# Patient Record
Sex: Male | Born: 1965 | Race: White | Hispanic: No | Marital: Married | Smoking: Never smoker
Health system: Southern US, Community
[De-identification: ages and names within clinical notes are randomized; demographics above are authoritative.]

## PROBLEM LIST (undated history)

## (undated) DIAGNOSIS — Z460 Encounter for fitting and adjustment of spectacles and contact lenses: Secondary | ICD-10-CM

## (undated) DIAGNOSIS — K219 Gastro-esophageal reflux disease without esophagitis: Secondary | ICD-10-CM

## (undated) DIAGNOSIS — L570 Actinic keratosis: Secondary | ICD-10-CM

## (undated) DIAGNOSIS — I10 Essential (primary) hypertension: Secondary | ICD-10-CM

## (undated) DIAGNOSIS — Q2381 Bicuspid aortic valve: Secondary | ICD-10-CM

## (undated) DIAGNOSIS — Z8669 Personal history of other diseases of the nervous system and sense organs: Secondary | ICD-10-CM

## (undated) DIAGNOSIS — M7541 Impingement syndrome of right shoulder: Secondary | ICD-10-CM

## (undated) DIAGNOSIS — E785 Hyperlipidemia, unspecified: Secondary | ICD-10-CM

## (undated) DIAGNOSIS — S22089A Unspecified fracture of T11-T12 vertebra, initial encounter for closed fracture: Secondary | ICD-10-CM

## (undated) DIAGNOSIS — I712 Thoracic aortic aneurysm, without rupture, unspecified: Secondary | ICD-10-CM

## (undated) DIAGNOSIS — Q231 Congenital insufficiency of aortic valve: Secondary | ICD-10-CM

## (undated) DIAGNOSIS — M75101 Unspecified rotator cuff tear or rupture of right shoulder, not specified as traumatic: Secondary | ICD-10-CM

## (undated) HISTORY — PX: COLONOSCOPY: SHX174

## (undated) HISTORY — PX: UPPER GI ENDOSCOPY: SHX6162

## (undated) HISTORY — DX: Actinic keratosis: L57.0

## (undated) HISTORY — DX: Unspecified fracture of t11-T12 vertebra, initial encounter for closed fracture: S22.089A

## (undated) HISTORY — DX: Personal history of other diseases of the nervous system and sense organs: Z86.69

---

## 2001-05-08 ENCOUNTER — Ambulatory Visit (HOSPITAL_COMMUNITY): Admission: RE | Admit: 2001-05-08 | Discharge: 2001-05-08 | Payer: Self-pay | Admitting: Cardiovascular Disease

## 2003-09-10 DIAGNOSIS — S22089A Unspecified fracture of T11-T12 vertebra, initial encounter for closed fracture: Secondary | ICD-10-CM

## 2003-09-10 HISTORY — DX: Unspecified fracture of t11-T12 vertebra, initial encounter for closed fracture: S22.089A

## 2006-02-07 ENCOUNTER — Ambulatory Visit: Payer: Self-pay

## 2006-03-01 ENCOUNTER — Ambulatory Visit: Payer: Self-pay | Admitting: Unknown Physician Specialty

## 2006-09-05 ENCOUNTER — Ambulatory Visit: Payer: Self-pay | Admitting: Gastroenterology

## 2008-08-16 ENCOUNTER — Ambulatory Visit: Payer: Self-pay

## 2008-09-08 ENCOUNTER — Ambulatory Visit: Payer: Self-pay | Admitting: Unknown Physician Specialty

## 2009-04-01 ENCOUNTER — Ambulatory Visit: Payer: Self-pay | Admitting: Unknown Physician Specialty

## 2010-04-19 ENCOUNTER — Ambulatory Visit: Payer: Self-pay | Admitting: Otolaryngology

## 2011-11-23 ENCOUNTER — Observation Stay: Payer: Self-pay | Admitting: Surgery

## 2011-11-23 LAB — COMPREHENSIVE METABOLIC PANEL
Albumin: 4.5 g/dL (ref 3.4–5.0)
Alkaline Phosphatase: 43 U/L — ABNORMAL LOW (ref 50–136)
BUN: 17 mg/dL (ref 7–18)
Chloride: 101 mmol/L (ref 98–107)
Creatinine: 1.05 mg/dL (ref 0.60–1.30)
Osmolality: 282 (ref 275–301)
Potassium: 3.8 mmol/L (ref 3.5–5.1)

## 2011-11-23 LAB — CBC
HGB: 15.4 g/dL (ref 13.0–18.0)
MCHC: 33.6 g/dL (ref 32.0–36.0)
RBC: 4.67 10*6/uL (ref 4.40–5.90)
WBC: 11.2 10*3/uL — ABNORMAL HIGH (ref 3.8–10.6)

## 2011-11-23 LAB — URINALYSIS, COMPLETE
Blood: NEGATIVE
Glucose,UR: NEGATIVE mg/dL (ref 0–75)
Nitrite: NEGATIVE
Ph: 7 (ref 4.5–8.0)
Protein: NEGATIVE
Specific Gravity: 1.02 (ref 1.003–1.030)

## 2011-11-23 LAB — LIPASE, BLOOD: Lipase: 99 U/L (ref 73–393)

## 2011-12-09 ENCOUNTER — Ambulatory Visit: Payer: Self-pay | Admitting: Surgery

## 2011-12-09 HISTORY — PX: APPENDECTOMY: SHX54

## 2012-08-15 ENCOUNTER — Ambulatory Visit: Payer: Self-pay | Admitting: Unknown Physician Specialty

## 2012-08-26 ENCOUNTER — Encounter (HOSPITAL_BASED_OUTPATIENT_CLINIC_OR_DEPARTMENT_OTHER): Payer: Self-pay | Admitting: *Deleted

## 2012-08-26 NOTE — Progress Notes (Signed)
Pt ENT in [place]-wife pediatrician,brother cardiology [place] Had append 4/13 [facility]-no problems About 06 had cardiac cath at [facility]-normal-no problems since-no records available

## 2012-08-28 ENCOUNTER — Ambulatory Visit (HOSPITAL_BASED_OUTPATIENT_CLINIC_OR_DEPARTMENT_OTHER): Payer: BC Managed Care – PPO | Admitting: Anesthesiology

## 2012-08-28 ENCOUNTER — Encounter (HOSPITAL_BASED_OUTPATIENT_CLINIC_OR_DEPARTMENT_OTHER): Payer: Self-pay | Admitting: Anesthesiology

## 2012-08-28 ENCOUNTER — Encounter (HOSPITAL_BASED_OUTPATIENT_CLINIC_OR_DEPARTMENT_OTHER): Payer: Self-pay | Admitting: *Deleted

## 2012-08-28 ENCOUNTER — Encounter (HOSPITAL_BASED_OUTPATIENT_CLINIC_OR_DEPARTMENT_OTHER): Payer: Self-pay | Admitting: Orthopedic Surgery

## 2012-08-28 ENCOUNTER — Encounter (HOSPITAL_BASED_OUTPATIENT_CLINIC_OR_DEPARTMENT_OTHER)
Admission: RE | Disposition: A | Discharge status: Home / Self Care | Payer: Self-pay | Source: Ambulatory Visit | Attending: Orthopedic Surgery

## 2012-08-28 ENCOUNTER — Ambulatory Visit (HOSPITAL_BASED_OUTPATIENT_CLINIC_OR_DEPARTMENT_OTHER)
Admission: RE | Admit: 2012-08-28 | Discharge: 2012-08-28 | Disposition: A | Discharge status: Home / Self Care | Payer: BC Managed Care – PPO | Source: Ambulatory Visit | Attending: Orthopedic Surgery | Admitting: Orthopedic Surgery

## 2012-08-28 DIAGNOSIS — Z9089 Acquired absence of other organs: Secondary | ICD-10-CM | POA: Insufficient documentation

## 2012-08-28 DIAGNOSIS — M25819 Other specified joint disorders, unspecified shoulder: Secondary | ICD-10-CM | POA: Insufficient documentation

## 2012-08-28 DIAGNOSIS — M67919 Unspecified disorder of synovium and tendon, unspecified shoulder: Secondary | ICD-10-CM | POA: Insufficient documentation

## 2012-08-28 DIAGNOSIS — M24819 Other specific joint derangements of unspecified shoulder, not elsewhere classified: Secondary | ICD-10-CM | POA: Insufficient documentation

## 2012-08-28 DIAGNOSIS — K219 Gastro-esophageal reflux disease without esophagitis: Secondary | ICD-10-CM | POA: Insufficient documentation

## 2012-08-28 DIAGNOSIS — M7541 Impingement syndrome of right shoulder: Secondary | ICD-10-CM

## 2012-08-28 DIAGNOSIS — M719 Bursopathy, unspecified: Secondary | ICD-10-CM | POA: Insufficient documentation

## 2012-08-28 DIAGNOSIS — E785 Hyperlipidemia, unspecified: Secondary | ICD-10-CM | POA: Insufficient documentation

## 2012-08-28 DIAGNOSIS — M75101 Unspecified rotator cuff tear or rupture of right shoulder, not specified as traumatic: Secondary | ICD-10-CM | POA: Diagnosis present

## 2012-08-28 HISTORY — DX: Gastro-esophageal reflux disease without esophagitis: K21.9

## 2012-08-28 HISTORY — DX: Impingement syndrome of right shoulder: M75.41

## 2012-08-28 HISTORY — DX: Unspecified rotator cuff tear or rupture of right shoulder, not specified as traumatic: M75.101

## 2012-08-28 HISTORY — DX: Hyperlipidemia, unspecified: E78.5

## 2012-08-28 HISTORY — DX: Encounter for fitting and adjustment of spectacles and contact lenses: Z46.0

## 2012-08-28 HISTORY — PX: SHOULDER ARTHROSCOPY WITH ROTATOR CUFF REPAIR AND SUBACROMIAL DECOMPRESSION: SHX5686

## 2012-08-28 SURGERY — SHOULDER ARTHROSCOPY WITH ROTATOR CUFF REPAIR AND SUBACROMIAL DECOMPRESSION
Anesthesia: Regional | Site: Shoulder | Laterality: Right | Wound class: Clean

## 2012-08-28 MED ORDER — BUPIVACAINE-EPINEPHRINE PF 0.5-1:200000 % IJ SOLN
INTRAMUSCULAR | Status: DC | PRN
  Administered 2012-08-28: 25 mL

## 2012-08-28 MED ORDER — PROPOFOL 10 MG/ML IV BOLUS
INTRAVENOUS | Status: DC | PRN
  Administered 2012-08-28: 180 mg via INTRAVENOUS

## 2012-08-28 MED ORDER — PROMETHAZINE HCL 25 MG PO TABS: 25.0000 mg | ORAL_TABLET | Freq: Four times a day (QID) | ORAL | Status: DC | PRN

## 2012-08-28 MED ORDER — DEXAMETHASONE SODIUM PHOSPHATE 4 MG/ML IJ SOLN
INTRAMUSCULAR | Status: DC | PRN
  Administered 2012-08-28: 10 mg via INTRAVENOUS

## 2012-08-28 MED ORDER — HYDROMORPHONE HCL PF 1 MG/ML IJ SOLN
0.2500 mg | INTRAMUSCULAR | Status: DC | PRN
Start: 2012-08-28 — End: 2012-08-28

## 2012-08-28 MED ORDER — METHOCARBAMOL 500 MG PO TABS: 500.0000 mg | ORAL_TABLET | Freq: Four times a day (QID) | ORAL | Status: DC

## 2012-08-28 MED ORDER — LIDOCAINE HCL (CARDIAC) 20 MG/ML IV SOLN
INTRAVENOUS | Status: DC | PRN
  Administered 2012-08-28: 80 mg via INTRAVENOUS

## 2012-08-28 MED ORDER — FENTANYL CITRATE 0.05 MG/ML IJ SOLN
INTRAMUSCULAR | Status: DC | PRN
  Administered 2012-08-28: 100 ug via INTRAVENOUS

## 2012-08-28 MED ORDER — MIDAZOLAM HCL 2 MG/2ML IJ SOLN
1.0000 mg | INTRAMUSCULAR | Status: DC | PRN
  Administered 2012-08-28: 2 mg via INTRAVENOUS

## 2012-08-28 MED ORDER — DEXAMETHASONE SODIUM PHOSPHATE 4 MG/ML IJ SOLN
INTRAMUSCULAR | Status: DC | PRN
  Administered 2012-08-28: 4 mg

## 2012-08-28 MED ORDER — OXYCODONE HCL 5 MG/5ML PO SOLN: 5.0000 mg | Freq: Once | ORAL | Status: DC | PRN

## 2012-08-28 MED ORDER — LACTATED RINGERS IV SOLN
INTRAVENOUS | Status: DC
Start: 2012-08-28 — End: 2012-08-28
  Administered 2012-08-28 (×2): via INTRAVENOUS

## 2012-08-28 MED ORDER — SUCCINYLCHOLINE CHLORIDE 20 MG/ML IJ SOLN
INTRAMUSCULAR | Status: DC | PRN
  Administered 2012-08-28: 100 mg via INTRAVENOUS

## 2012-08-28 MED ORDER — OXYCODONE HCL 5 MG PO TABS: 5.0000 mg | ORAL_TABLET | Freq: Once | ORAL | Status: DC | PRN

## 2012-08-28 MED ORDER — OXYCODONE-ACETAMINOPHEN 10-325 MG PO TABS: 1.0000 | ORAL_TABLET | Freq: Four times a day (QID) | ORAL | Status: DC | PRN

## 2012-08-28 MED ORDER — ONDANSETRON HCL 4 MG/2ML IJ SOLN
INTRAMUSCULAR | Status: DC | PRN
  Administered 2012-08-28: 4 mg via INTRAVENOUS

## 2012-08-28 MED ORDER — PROMETHAZINE HCL 25 MG/ML IJ SOLN: 6.2500 mg | INTRAMUSCULAR | Status: DC | PRN

## 2012-08-28 MED ORDER — FENTANYL CITRATE 0.05 MG/ML IJ SOLN
50.0000 ug | Freq: Once | INTRAMUSCULAR | Status: AC
  Administered 2012-08-28: 100 ug via INTRAVENOUS

## 2012-08-28 MED ORDER — SENNA-DOCUSATE SODIUM 8.6-50 MG PO TABS: 1.0000 | ORAL_TABLET | Freq: Every day | ORAL | Status: DC

## 2012-08-28 MED ORDER — CEFAZOLIN SODIUM-DEXTROSE 2-3 GM-% IV SOLR
INTRAVENOUS | Status: DC | PRN
  Administered 2012-08-28: 2 g via INTRAVENOUS

## 2012-08-28 MED ORDER — SODIUM CHLORIDE 0.9 % IR SOLN
Status: DC | PRN
  Administered 2012-08-28: 6000 mL

## 2012-08-28 SURGICAL SUPPLY — 65 items
ANCHOR SUT BIO SW 4.75X19.1 (Anchor) ×2 IMPLANT
BENZOIN TINCTURE PRP APPL 2/3 (GAUZE/BANDAGES/DRESSINGS) ×2 IMPLANT
BLADE CUTTER GATOR 3.5 (BLADE) ×2 IMPLANT
BLADE GREAT WHITE 4.2 (BLADE) IMPLANT
BLADE SURG 15 STRL LF DISP TIS (BLADE) IMPLANT
BLADE SURG 15 STRL SS (BLADE)
BUR OVAL 4.0 (BURR) ×2 IMPLANT
BUR OVAL 6.0 (BURR) IMPLANT
CANISTER OMNI JUG 16 LITER (MISCELLANEOUS) ×2 IMPLANT
CANNULA 5.75X71 LONG (CANNULA) ×2 IMPLANT
CANNULA TWIST IN 8.25X7CM (CANNULA) ×2 IMPLANT
CLOTH BEACON ORANGE TIMEOUT ST (SAFETY) ×2 IMPLANT
DECANTER SPIKE VIAL GLASS SM (MISCELLANEOUS) IMPLANT
DRAPE INCISE IOBAN 66X45 STRL (DRAPES) ×2 IMPLANT
DRAPE SHOULDER BEACH CHAIR (DRAPES) ×2 IMPLANT
DRAPE U 20/CS (DRAPES) ×2 IMPLANT
DRAPE U-SHAPE 47X51 STRL (DRAPES) ×2 IMPLANT
DRSG PAD ABDOMINAL 8X10 ST (GAUZE/BANDAGES/DRESSINGS) ×2 IMPLANT
DURAPREP 26ML APPLICATOR (WOUND CARE) ×2 IMPLANT
ELECT REM PT RETURN 9FT ADLT (ELECTROSURGICAL)
ELECTRODE REM PT RTRN 9FT ADLT (ELECTROSURGICAL) IMPLANT
FIBERSTICK 2 (SUTURE) IMPLANT
GLOVE BIO SURGEON STRL SZ 6.5 (GLOVE) ×6 IMPLANT
GLOVE BIO SURGEON STRL SZ8 (GLOVE) ×2 IMPLANT
GLOVE BIOGEL PI IND STRL 7.0 (GLOVE) ×2 IMPLANT
GLOVE BIOGEL PI IND STRL 8 (GLOVE) ×2 IMPLANT
GLOVE BIOGEL PI INDICATOR 7.0 (GLOVE) ×2
GLOVE BIOGEL PI INDICATOR 8 (GLOVE) ×2
GLOVE ORTHO TXT STRL SZ7.5 (GLOVE) ×4 IMPLANT
GOWN BRE IMP PREV XXLGXLNG (GOWN DISPOSABLE) ×4 IMPLANT
GOWN PREVENTION PLUS XLARGE (GOWN DISPOSABLE) ×4 IMPLANT
IMMOBILIZER SHOULDER XLGE (ORTHOPEDIC SUPPLIES) ×2 IMPLANT
KIT SHOULDER TRACTION (DRAPES) ×2 IMPLANT
LASSO SUT 90 DEGREE (SUTURE) IMPLANT
NEEDLE SCORPION MULTI FIRE (NEEDLE) ×2 IMPLANT
PACK ARTHROSCOPY DSU (CUSTOM PROCEDURE TRAY) ×2 IMPLANT
PACK BASIN DAY SURGERY FS (CUSTOM PROCEDURE TRAY) ×2 IMPLANT
SET ARTHROSCOPY TUBING (MISCELLANEOUS) ×1
SET ARTHROSCOPY TUBING LN (MISCELLANEOUS) ×1 IMPLANT
SHEET MEDIUM DRAPE 40X70 STRL (DRAPES) ×2 IMPLANT
SLEEVE SCD COMPRESS KNEE MED (MISCELLANEOUS) ×2 IMPLANT
SLING ARM FOAM STRAP LRG (SOFTGOODS) IMPLANT
SLING ARM FOAM STRAP MED (SOFTGOODS) IMPLANT
SLING ARM FOAM STRAP XLG (SOFTGOODS) IMPLANT
SLING ARM IMMOBILIZER LRG (SOFTGOODS) ×2 IMPLANT
SLING ARM IMMOBILIZER MED (SOFTGOODS) IMPLANT
SPONGE GAUZE 4X4 12PLY (GAUZE/BANDAGES/DRESSINGS) ×2 IMPLANT
STRIP CLOSURE SKIN 1/2X4 (GAUZE/BANDAGES/DRESSINGS) ×2 IMPLANT
SUT FIBERWIRE #2 38 T-5 BLUE (SUTURE)
SUT LASSO 45 DEGREE (SUTURE) IMPLANT
SUT LASSO 45 DEGREE LEFT (SUTURE) IMPLANT
SUT LASSO 45D RIGHT (SUTURE) IMPLANT
SUT MNCRL AB 4-0 PS2 18 (SUTURE) ×2 IMPLANT
SUT PDS AB 1 CT  36 (SUTURE) ×1
SUT PDS AB 1 CT 36 (SUTURE) ×1 IMPLANT
SUT TIGER TAPE 7 IN WHITE (SUTURE) IMPLANT
SUT VIC AB 3-0 SH 27 (SUTURE)
SUT VIC AB 3-0 SH 27X BRD (SUTURE) IMPLANT
SUTURE FIBERWR #2 38 T-5 BLUE (SUTURE) IMPLANT
TAPE FIBER 2MM 7IN #2 BLUE (SUTURE) ×2 IMPLANT
TOWEL OR 17X24 6PK STRL BLUE (TOWEL DISPOSABLE) ×2 IMPLANT
TOWEL OR NON WOVEN STRL DISP B (DISPOSABLE) ×2 IMPLANT
TUBE CONNECTING 20X1/4 (TUBING) ×2 IMPLANT
WAND STAR VAC 90 (SURGICAL WAND) ×2 IMPLANT
WATER STERILE IRR 1000ML POUR (IV SOLUTION) ×2 IMPLANT

## 2012-08-28 NOTE — Anesthesia Procedure Notes (Addendum)
Anesthesia Regional Block:  Interscalene brachial plexus block  Pre-Anesthetic Checklist: ,, timeout performed, Correct Patient, Correct Site, Correct Laterality, Correct Procedure, Correct Position, site marked, Risks and benefits discussed,  Surgical consent,  Pre-op evaluation,  At surgeon's request and post-op pain management  Laterality: Right  Prep: chloraprep       Needles:  Injection technique: Single-shot  Needle Type: Echogenic Stimulator Needle     Needle Length: 5cm 5 cm Needle Gauge: 22 and 22 G    Additional Needles:  Procedures: ultrasound guided (picture in chart) and nerve stimulator Interscalene brachial plexus block  Nerve Stimulator or Paresthesia:  Response: 0.48 mA,   Additional Responses:   Narrative:  Start time: 08/28/2012 7:16 AM End time: 08/28/2012 7:28 AM Injection made incrementally with aspirations every 3 mL. Anesthesiologist: Dr Gypsy Balsam  Additional Notes: [id number] R ISB POP CHG prep, sterile tech # 22 stim/echo Arrow needle with stim down to .48ma and good visualization on [place] PIX in chart Multiple neg asp Marc .5% w/epi 1:200000 25cc+decadron 4mg  infiltrated No compl Dr Gypsy Balsam   Procedure Name: Intubation Date/Time: 08/28/2012 7:51 AM Performed by: Burna Cash Pre-anesthesia Checklist: Patient identified, Emergency Drugs available, Suction available and Patient being monitored Patient Re-evaluated:Patient Re-evaluated prior to inductionOxygen Delivery Method: Circle System Utilized Preoxygenation: Pre-oxygenation with 100% oxygen Intubation Type: IV induction Ventilation: Mask ventilation without difficulty Laryngoscope Size: Mac and 3 Grade View: Grade II Tube type: Oral Number of attempts: 1 Airway Equipment and Method: stylet and oral airway Placement Confirmation: ETT inserted through vocal cords under direct vision,  positive ETCO2 and breath sounds checked- equal and bilateral Secured at: 22 cm Tube secured with:  Tape Dental Injury: Teeth and Oropharynx as per pre-operative assessment

## 2012-08-28 NOTE — Op Note (Signed)
08/28/2012  9:29 AM  PATIENT:  Jose Boyer    PRE-OPERATIVE DIAGNOSIS:  Right shoulder full-thickness rotator cuff tear, impingement syndrome, posterior labral tear  POST-OPERATIVE DIAGNOSIS:  Same  PROCEDURE:  Right shoulder arthroscopy with rotator cuff repair, acromioplasty, and labral debridement  SURGEON:  Eulas Post, MD  PHYSICIAN ASSISTANT: Janace Litten, OPA-C, present and scrubbed throughout the case, critical for completion in a timely fashion, and for retraction, instrumentation, and closure.  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 He failed conservative measures including injections, physical therapy, and anti-inflammatories. He elected for surgical management.   The risks benefits and alternatives were discussed with the patient preoperatively including but not limited to the risks of infection, bleeding, nerve injury, cardiopulmonary complications, the need for revision surgery, among others, and the patient was willing to proceed. We also discussed the risks for recurrent tear, incomplete relief of symptoms, persistent overhead strength weakness/deficit, among others.  OPERATIVE IMPLANTS: Arthrex 4.75 mm bio composite swivel lock anchor x1 with inverted fiber tape  OPERATIVE FINDINGS: There was a full-thickness supraspinatus tear, extending from the junction of the infraspinatus towards leading edge of the supraspinatus. This was 90% thickness almost the entire width, and 100% thickness both at the front and back. There was basically just the capsule that was hanging on and the center. There was substantial CA ligament fraying. There was a type II acromion with clear evidence of impingement.  The tendon quality was reasonably good. The bone quality was also quite good.  The superior labrum had some fraying, with type II tear, as well as  some posterior cracking of the labrum, however overall the labrum was not unstable, and simply needed debridement.  The subscapularis and biceps pulley was intact and the biceps tendon was intact. There was some undersurface fraying of the infraspinatus, which was debrided.  The glenohumeral articular surfaces were in good condition. The shoulder had full motion during exam under anesthesia with no evidence for instability.  OPERATIVE PROCEDURE: The patient was brought to the operating room and placed in supine position. General anesthesia was administered. Regional block targeting given. The right upper extremity was examined and had full motion with normal stability. It was then prepped and draped in usual sterile fashion. Diagnostic arthroscopy was carried out with the above-named findings. He was in a semilateral decubitus position with all bony prominences padded.  The arthroscopic shaver was used to debride the superior and posterior labrum, and also the undersurface of the infraspinatus. I used a PDS tag suture to identify the tear, and I also switched portals and confirmed the position of the tear and the labral fraying from viewing from the anterior.  I then went to the subacromial space, and performed a bursectomy, although there was not a significant amount of bursitis. There was however significant fraying of the CA ligament. The CA ligament was released, and acromioplasty performed with a for a bur.  I debrided the edges of the rotator cuff tear, and then performed a light tubercleplasty. Viewing from posterior, I placed an inverted fiber tape through the supraspinatus tendon, spreading the tails of the suture to the anterior and posterior most margins of the tear, passing the sutures medially. Excellent purchase on the tendon was achieved.  I then placed a superior cannula, and secured the tendon to the lateral margin of the tuberosity using a 4.75 swivel lock  anchor. Excellent fixation  achieved. The rotator cuff then moved as a single unit with the humeral head.  I confirmed satisfactory acromioplasty viewing from the lateral side, and then removed the arthroscopic instruments, and then closed the portals with Monocryl followed by Steri-Strips and sterile gauze. He was awakened and returned to the PACU in stable and satisfactory condition. There no complications and he tolerated the procedure well.

## 2012-08-28 NOTE — Progress Notes (Signed)
Assisted Dr. Kasik with right, ultrasound guided, interscalene  block. Side rails up, monitors on throughout procedure. See vital signs in flow sheet. Tolerated Procedure well. 

## 2012-08-28 NOTE — Anesthesia Postprocedure Evaluation (Signed)
  Anesthesia Post-op Note  Patient: Jose Boyer  Procedure(s) Performed: Procedure(s) (LRB) with comments: SHOULDER ARTHROSCOPY WITH ROTATOR CUFF REPAIR AND SUBACROMIAL DECOMPRESSION (Right) - RIGHT SHOULDER DEBRIDEMENT LIMITED, ARTHROSCOPY SHJOULDER DECOMPRESSION SUBACROMIAL PARTIAL ACROMIOPLASTY WITH CORACOACROMIAL RELEASE, ARTHROSCOPY SHOULDER WITH ROTATOR CUFF REPAIR  Patient Location: PACU  Anesthesia Type:GA combined with regional for post-op pain  Level of Consciousness: awake and alert   Airway and Oxygen Therapy: Patient Spontanous Breathing  Post-op Pain: none  Post-op Assessment: Post-op Vital signs reviewed, Patient's Cardiovascular Status Stable, Respiratory Function Stable, Patent Airway, No signs of Nausea or vomiting and Pain level controlled  Post-op Vital Signs: stable  Complications: No apparent anesthesia complications

## 2012-08-28 NOTE — Transfer of Care (Signed)
Immediate Anesthesia Transfer of Care Note  Patient: Jose Boyer  Procedure(s) Performed: Procedure(s) (LRB) with comments: SHOULDER ARTHROSCOPY WITH ROTATOR CUFF REPAIR AND SUBACROMIAL DECOMPRESSION (Right) - RIGHT SHOULDER DEBRIDEMENT LIMITED, ARTHROSCOPY SHJOULDER DECOMPRESSION SUBACROMIAL PARTIAL ACROMIOPLASTY WITH CORACOACROMIAL RELEASE, ARTHROSCOPY SHOULDER WITH ROTATOR CUFF REPAIR  Patient Location: PACU  Anesthesia Type:GA combined with regional for post-op pain  Level of Consciousness: awake, alert  and oriented  Airway & Oxygen Therapy: Patient Spontanous Breathing and Patient connected to face mask oxygen  Post-op Assessment: Report given to PACU RN and Post -op Vital signs reviewed and stable  Post vital signs: Reviewed and stable  Complications: No apparent anesthesia complications

## 2012-08-28 NOTE — Anesthesia Preprocedure Evaluation (Signed)
Anesthesia Evaluation  Patient identified by MRN, date of birth, ID band Patient awake    Reviewed: Allergy & Precautions, H&P , NPO status , Patient's Chart, lab work & pertinent test results  Airway Mallampati: I TM Distance: >3 FB Neck ROM: Full    Dental   Pulmonary  breath sounds clear to auscultation        Cardiovascular Rhythm:Regular Rate:Normal     Neuro/Psych    GI/Hepatic GERD-  Medicated,  Endo/Other    Renal/GU      Musculoskeletal   Abdominal   Peds  Hematology   Anesthesia Other Findings   Reproductive/Obstetrics                           Anesthesia Physical Anesthesia Plan  ASA: I  Anesthesia Plan: General   Post-op Pain Management:    Induction: Intravenous  Airway Management Planned: Oral ETT  Additional Equipment:   Intra-op Plan:   Post-operative Plan: Extubation in OR  Informed Consent: I have reviewed the patients History and Physical, chart, labs and discussed the procedure including the risks, benefits and alternatives for the proposed anesthesia with the patient or authorized representative who has indicated his/her understanding and acceptance.     Plan Discussed with: CRNA and Surgeon  Anesthesia Plan Comments:         Anesthesia Quick Evaluation

## 2012-08-28 NOTE — H&P (Signed)
  PREOPERATIVE H&P  Chief Complaint: RIGHT SHOULDER DISORDER ARTICULAR CARTILAGE, DISORDERS OF BURSAE/TENDONS IN SHOULDER REGION, UNSPECIFIED, COMPLETE RUPTURE OF ROTATOR CUFF 718.01, 726.10, 727.61  HPI: Jose Boyer is a 46 y.o. male who presents for preoperative history and physical with a diagnosis of RIGHT SHOULDER DISORDER ARTICULAR CARTILAGE, DISORDERS OF BURSAE/TENDONS IN SHOULDER REGION, UNSPECIFIED, COMPLETE RUPTURE OF ROTATOR CUFF 718.01, 726.10, 727.61. Symptoms are rated as moderate to severe, and have been worsening.  This is significantly impairing activities of daily living.  He has elected for surgical management. He has failed injections, activity modification, exercises, anti-inflammatories. Pain is been going on for on last 6 months.  Past Medical History  Diagnosis Date  . Hyperlipemia   . GERD (gastroesophageal reflux disease)   . Contact lens/glasses fitting     wears contacts or glasses   Past Surgical History  Procedure Date  . [facility]endectomy 4/13    [facility]  . Upper gi endoscopy     x2  . Colonoscopy     x2   History   Social History  . Marital Status: Married    Spouse Name: N/A    Number of Children: N/A  . Years of Education: N/A   Social History Main Topics  . Smoking status: Never Smoker   . Smokeless tobacco: None  . Alcohol Use: Yes     Comment: occ  . Drug Use: No  . Sexually Active:    Other Topics Concern  . None   Social History Narrative  . None   History reviewed. No pertinent family history. No Known Allergies Prior to Admission medications   Medication Sig Start Date End Date Taking? Authorizing Provider  omeprazole (PRILOSEC) 40 MG capsule Take 40 mg by mouth every evening.   Yes Historical Provider, MD  rosuvastatin (CRESTOR) 20 MG tablet Take 20 mg by mouth every evening.   Yes Historical Provider, MD     Positive ROS: All other systems have been reviewed and were otherwise negative with the exception of those mentioned  in the HPI and as above.  Physical Exam: General: Alert, no acute distress Cardiovascular: No pedal edema Respiratory: No cyanosis, no use of accessory musculature GI: No organomegaly, abdomen is soft and non-tender Skin: No lesions in the area of chief complaint Neurologic: Sensation intact distally Psychiatric: Patient is competent for consent with normal mood and affect Lymphatic: No axillary or cervical lymphadenopathy  MUSCULOSKELETAL: Right shoulder with positive impingement signs and weakness with overhead function.  Assessment: RIGHT SHOULDER DISORDER ARTICULAR CARTILAGE, DISORDERS OF BURSAE/TENDONS IN SHOULDER REGION, UNSPECIFIED, COMPLETE RUPTURE OF ROTATOR CUFF 718.01, 726.10, 727.61  Plan: Plan for Procedure(s): SHOULDER ARTHROSCOPY WITH ROTATOR CUFF REPAIR AND SUBACROMIAL DECOMPRESSION  The risks benefits and alternatives were discussed with the patient including but not limited to the risks of nonoperative treatment, versus surgical intervention including infection, bleeding, nerve injury,  blood clots, cardiopulmonary complications, morbidity, mortality, among others, and they were willing to proceed.   Charna Neeb P, MD Cell [phone] Pager [phone]  08/28/2012 7:39 AM

## 2012-08-31 ENCOUNTER — Encounter (HOSPITAL_BASED_OUTPATIENT_CLINIC_OR_DEPARTMENT_OTHER): Payer: Self-pay | Admitting: Orthopedic Surgery

## 2012-08-31 LAB — POCT HEMOGLOBIN-HEMACUE: Hemoglobin: 14.9 g/dL (ref 13.0–17.0)

## 2012-11-05 ENCOUNTER — Telehealth: Payer: Self-pay | Admitting: Internal Medicine

## 2012-11-05 NOTE — Telephone Encounter (Signed)
Called Dr. Mikey Bussing office and told them to let him know to call the office and we could schedule him a new pt appt with Dr. Darrick Huntsman. Per Dr. Darrick Huntsman.

## 2015-01-01 NOTE — Discharge Summary (Signed)
PATIENT NAME:  Jose Boyer, Jose Boyer MR#:  [id number] DATE OF BIRTH:  [birth year]  DATE OF ADMISSION:  12/09/2011 DATE OF DISCHARGE:  12/09/2011  BRIEF HISTORY: [name] is a 49 year old gentleman with chronic changes of appendicitis on his CT scan. He has not had any recent symptoms consistent with appendicitis, but there was concern about a possible premalignant condition as well as chronic appendicitis. After appropriate discussion, he elected to proceed with surgical intervention. After appropriate preoperative preparation and informed consent, he was taken to surgery on the morning of 12/09/2011. He underwent a laparoscopic appendectomy which was uncomplicated. The appendix was thickened without evidence of a discrete mass. The procedure was accomplished without difficulty. He had some mild pain postoperatively and was able to tolerate a regular diet in the evening. He is discharged home tonight to be followed in the office in 7 to 10 days' time. Bathing, activity and driving instructions were given to the patient. He is to continue his home medications and take Percocet for pain.   DISCHARGE MEDICATIONS:  1. Omeprazole 40 mg p.o. daily.  2. Crestor 10 mg p.o. daily.  3. Aspirin 325 mg p.o. daily.   DISCHARGE DIAGNOSIS: Chronic appendicitis.   SURGERY: Laparoscopic appendectomy.   ____________________________ Rodena Goldmann III, MD rle:cbb D: 12/09/2011 20:30:28 ET T: 12/10/2011 13:28:46 ET JOB#: [id number]  cc: Rodena Goldmann III, MD, <Dictator> Rodena Goldmann MD ELECTRONICALLY SIGNED 12/10/2011 19:24

## 2015-01-01 NOTE — Op Note (Signed)
PATIENT NAME:  Jose Boyer, Jose Boyer MR#:  [id number] DATE OF BIRTH:  20-Mar-1966  DATE OF PROCEDURE:  12/09/2011  PREOPERATIVE DIAGNOSIS: Chronic appendicitis.   POSTOPERATIVE DIAGNOSIS: Chronic appendicitis.   PROCEDURE PERFORMED: Laparoscopic appendectomy.   SURGEON: Rodena Goldmann III, MD  ANESTHESIA: General.  DESCRIPTION OF PROCEDURE: With the patient in supine position after induction of appropriate general anesthesia, the patient's abdomen was prepped with ChloraPrep and draped with sterile towels. An alcohol wipe Betadine impregnated Steri-Drape were utilized. Patient placed head down, feet up position. A small infraumbilical incision was made in standard fashion, carried down bluntly through subcutaneous tissue. Veress needle was used to cannulate the peritoneal cavity. CO2 was insufflated to appropriate pressure measurements. When approximately 2.5 liters of CO2 were instilled the Veress needle was withdrawn. An 11 mm [employer] port inserted into the peritoneal cavity. Intraperitoneal position was confirmed. CO2 was reinsufflated. The patient was rotated slightly to the left side. A mid epigastric transverse incision was made and 11 mm port inserted under direct vision. The right lower quadrant was investigated. Did appear to be a dilated thickened, chronically inflamed appendix. The base of the appendix looked near normal and did not appear to be any evidence of a tumor in the appendix. The right colon appeared to be unremarkable with the exception of multiple adhesions along the right colon to the pelvic side wall suggesting chronic inflammatory change. Adhesions were taken down with the LigaSure apparatus. Base of the appendix was dissected free. A suprapubic transverse incision was made and a 12 mm port inserted under direct vision. The base of the appendix was divided with a single application of the Endo GIA stapling device carrying a blue load and the mesoappendix was divided with two  applications of the Endo GIA stapler carrying a white load. The appendix was captured in an Endo Catch apparatus, removed through the suprapubic incision without difficulty. The area was copiously suctioned and irrigated with 2 liters of warm saline solution. Visualization of the abdomen did not reveal any other significant abnormalities. Specifically, left upper quadrant did not appear to have any gross abnormality. There were early bilateral direct hernias noted. The lower midline port was removed and the defect closed with the suture passer and a 0 Vicryl suture. The remaining incisions were closed with 5-0 nylon and infiltrated with 0.25% Marcaine for postoperative pain control. Sterile dressings were applied. The patient returned to the recovery room having tolerate procedure well. Sponge, instrument and needle counts were correct x2 in the Operating Room.   ____________________________ Rodena Goldmann III, MD rle:cms D: 12/09/2011 08:30:43 ET T: 12/09/2011 08:46:13 ET JOB#: [id number]  cc: Rodena Goldmann III, MD, <Dictator> Rodena Goldmann MD ELECTRONICALLY SIGNED 12/09/2011 21:42

## 2015-01-01 NOTE — Discharge Summary (Signed)
PATIENT NAME:  Jose Boyer, ROUDEBUSH MR#:  [id number] DATE OF BIRTH:  Jun 06, 1966  DATE OF ADMISSION:  11/23/2011 DATE OF DISCHARGE:  11/23/2011  BRIEF HISTORY: Jose Boyer is a 49 year old gentleman seen in the Emergency Room with significant abdominal pain. He has had problems with abdominal discomfort in an episodic fashion for many years. The work-up has always been negative. He presented to the Emergency Room because the pain did not resolve as it normally does and he continued to have pronounced left upper quadrant midepigastric, abdominal pain boring through to his back. He did not have any nausea or vomiting. Presented to the Emergency Room for pain control and further work-up.   Work-up in the Emergency Room revealed a slightly elevated white blood cell count of 11,000 and slightly elevated transaminases. Otherwise, his laboratory values were unremarkable. Plain films were unremarkable. CT scan was performed with contrast which demonstrated a thickened dilated appendix with evidence of periappendiceal inflammatory change. No other significant abnormalities were identified.   His clinical presentation did not suggest appendicitis. His clinical symptoms improved with pain medication. He had no fever and no other GI symptoms. 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 He was noted to have an abnormality in the appendix during colonoscopy three years ago. Will discharge him home on pain medication today to be followed in the office for discussion regarding possible elective appendectomy and possible extended colectomy. This plan has been outlined to the patient and his wife in detail and they are in agreement.   DISCHARGE MEDICATIONS:  1. Crestor. 2. Prilosec. 3. Aspirin. 4. Percocet 5/325 for pain.        FINAL DISCHARGE DIAGNOSIS: Abdominal pain.  ____________________________ Micheline Maze,  MD rle:cms D: 11/23/2011 17:56:33 ET T: 11/25/2011 11:17:43 ET JOB#: [id number]  cc: Micheline Maze, MD, <Dictator> Manya Silvas, MD Rodena Goldmann MD ELECTRONICALLY SIGNED 11/26/2011 10:28

## 2015-01-01 NOTE — H&P (Signed)
PATIENT NAME:  Jose Boyer, Jose Boyer MR#:  [id number] DATE OF BIRTH:  Jan 28, 1966  DATE OF ADMISSION:  11/23/2011  ADMITTING PHYSICIAN: Dia Crawford, MD  CHIEF COMPLAINT: Abdominal pain.   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 He has a history of intermittent abdominal pain which has defied workup to date. He has had left upper quadrant, midepigastric abdominal pain boring through to his back without nausea or vomiting. The pain comes on over a gradual period of time, is colicky in nature and then normally resolves spontaneously. He has been worked up with HIDA scan, ultrasound, and multiple endoscopies with no positive findings. All lab work has been done at [facility] and I do not have any of those results available to me at the present time. Allegedly it all has been normal. He has been evaluated and followed by the gastroenterology service at [facility].   His current episode began last evening, at approximately 6 o'clock, with slow onset of crampy abdominal pain that intensified over the course of the evening. He has been unable to get any rest and unable to get any relief from the pain. He took Vicodin last night without success. He had not passed any gas until this episode began. He does not feel nauseated and ate normally yesterday. He has no other symptoms at the present time.   PAST MEDICAL HISTORY: His past history is largely unremarkable. He does have a history of reflux esophagitis. He denies any history of hepatitis, yellow jaundice, pancreatitis, peptic ulcer disease, gallbladder disease, or diverticulitis. His only previous surgery was a traumatic injury to his elbow requiring repair. He has no cardiac disease, hypertension, or diabetes. He does have a family history of hypercholesterolemia and coronary artery disease.   CURRENT MEDICATIONS: Crestor, aspirin, and Prilosec.  DRUG ALLERGIES: No known drug  allergies.   SOCIAL HISTORY: He does not smoke cigarettes. 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  PHYSICAL EXAMINATION:   GENERAL: He is alert but obviously in distress from abdominal discomfort. He is accompanied by his wife.  VITAL SIGNS: Vitals are stable with a blood pressure of 148/74, heart rate 82 and regular, and oxygen saturation is stable.   HEENT: No scleral icterus. Pupils are equal and round. There is no facial deformity.   NECK: Supple without adenopathy. Trachea is midline.   LUNGS: Chest is clear with no adventitious sounds. He has normal pulmonary excursion.   HEART: No murmurs or gallops to my ear. He seems to be in normal sinus rhythm.   ABDOMEN: Generally soft with some mild left upper quadrant tenderness. He did not have any rebound, guarding, masses, or hernias noted. He does not really have any point tenderness. He has active bowel sounds. No groin hernias are noted.   LOWER EXTREMITIES: Full range of motion and no deformities.   PSYCHIATRIC: Normal affect and normal orientation.   IMPRESSION AND PLAN: Laboratory work is pending at the present time. We will start over with work-up of this problem, plan to admit him to observation, try to get his pain under control, look at laboratory values and x-rays, and perhaps perform a CT scan this morning. This plan has been discussed with the patient in detail. His wife, who is also a physician, is present for the interview. I do not see any acute surgical indication. They are both in agreement with the current plan.  ____________________________ Rodena Goldmann III, MD rle:slb D: 11/23/2011 09:06:46 ET     T: 11/23/2011 09:52:17 ET       JOB#: [id number] cc: Micheline Maze, MD, <Dictator> Manya Silvas, MD Rodena Goldmann MD ELECTRONICALLY SIGNED 11/23/2011 11:07

## 2016-04-19 DIAGNOSIS — E785 Hyperlipidemia, unspecified: Secondary | ICD-10-CM | POA: Diagnosis not present

## 2017-05-02 ENCOUNTER — Encounter: Payer: Self-pay | Admitting: General Surgery

## 2017-05-02 NOTE — Progress Notes (Signed)
This healthy 51 year old male has a five-year history of a nontender nodule to the right of the anus. This was initially identified while on a fishing trip. The area has not changed in size, has not been associated with any drainage or discharge and he has had no difficulty with defecation.  Examination of the perianal area showed a 1 x 3 mm lesion consistent with an ingrown hair/sebaceous cyst at the 3:00 location (knee-chest). No surrounding inflammation. This was approximately 2 cm from the anal opening. No other external anal abnormalities noted. There is a small 3 mm pink fleshy skin tag at the 10:00 position (knee-chest)  As the patient is asymptomatic, no intervention is required.

## 2017-05-16 DIAGNOSIS — M545 Low back pain: Secondary | ICD-10-CM | POA: Diagnosis not present

## 2017-05-16 DIAGNOSIS — M461 Sacroiliitis, not elsewhere classified: Secondary | ICD-10-CM | POA: Diagnosis not present

## 2017-05-16 DIAGNOSIS — M1611 Unilateral primary osteoarthritis, right hip: Secondary | ICD-10-CM | POA: Diagnosis not present

## 2017-05-30 DIAGNOSIS — R5383 Other fatigue: Secondary | ICD-10-CM | POA: Diagnosis not present

## 2017-05-31 LAB — BASIC METABOLIC PANEL
BUN: 24 — AB (ref 4–21)
Creatinine: 1 (ref 0.6–1.3)
GLUCOSE: 68
POTASSIUM: 4.1 (ref 3.4–5.3)
SODIUM: 141 (ref 137–147)

## 2017-05-31 LAB — TSH: TSH: 2.34 (ref 0.41–5.90)

## 2017-05-31 LAB — LIPID PANEL
Cholesterol: 285 — AB (ref 0–200)
HDL: 74 — AB (ref 35–70)
LDL Cholesterol: 189
Triglycerides: 108 (ref 40–160)

## 2017-05-31 LAB — CBC AND DIFFERENTIAL
HEMATOCRIT: 43 (ref 41–53)
Hemoglobin: 14.5 (ref 13.5–17.5)
Neutrophils Absolute: 2
Platelets: 197 (ref 150–399)
WBC: 4.4

## 2017-05-31 LAB — HEPATIC FUNCTION PANEL
ALT: 29 (ref 10–40)
AST: 28 (ref 14–40)
Alkaline Phosphatase: 53 (ref 25–125)
Bilirubin, Total: 0.4

## 2017-05-31 LAB — PSA: PSA: 0.4

## 2017-05-31 LAB — IRON,TIBC AND FERRITIN PANEL: Iron: 160

## 2017-07-14 ENCOUNTER — Ambulatory Visit (INDEPENDENT_AMBULATORY_CARE_PROVIDER_SITE_OTHER): Payer: 59 | Admitting: *Deleted

## 2017-07-14 ENCOUNTER — Telehealth (INDEPENDENT_AMBULATORY_CARE_PROVIDER_SITE_OTHER): Payer: 59 | Admitting: Cardiovascular Disease

## 2017-07-14 DIAGNOSIS — R002 Palpitations: Secondary | ICD-10-CM | POA: Diagnosis not present

## 2017-07-14 NOTE — Progress Notes (Signed)
  1.) Reason for visit: Palpitations  2.) Name of MD requesting visit: Dr. Rockey Situ  3.) H&P: Hyperlipidemia and reflux. Family cardiac history  4.) ROS related to problem: Family cardiac history  5.) Assessment and plan per MD: EKG obtained with NSR and PVC's. Dr. Rockey Situ in to review with patient.

## 2017-07-14 NOTE — Addendum Note (Signed)
Addended by: Valora Corporal on: 07/14/2017 11:52 AM   Modules accepted: Orders

## 2017-07-14 NOTE — Telephone Encounter (Signed)
See nurse visit. Patient in to have EKG and spoke with Dr. Rockey Situ.

## 2017-07-14 NOTE — Telephone Encounter (Signed)
Having palpitations never been seen in clinic but would like to know if he can have an ekg done in office    Patient c/o Palpitations:  High priority if patient c/o lightheadedness, shortness of breath, or chest pain  1) How long have you had palpitations/irregular HR/ Afib? Are you having the symptoms now? 1 week worsening in the last 24-48 hr  2) Are you currently experiencing lightheadedness, SOB or CP? None   3) Do you have a history of afib (atrial fibrillation) or irregular heart rhythm?   4) Have you checked your BP or HR? (document readings if available):   5) Are you experiencing any other symptoms? none

## 2017-07-14 NOTE — Telephone Encounter (Signed)
Patient scheduled for nurse visit to have EKG done.

## 2017-07-22 ENCOUNTER — Ambulatory Visit: Payer: Self-pay

## 2017-07-25 LAB — BASIC METABOLIC PANEL
BUN: 16 (ref 4–21)
CREATININE: 1 (ref 0.6–1.3)
GLUCOSE: 83

## 2017-07-25 LAB — LIPID PANEL
Cholesterol: 207 — AB (ref 0–200)
HDL: 66 (ref 35–70)
LDL CALC: 121
TRIGLYCERIDES: 100 (ref 40–160)

## 2017-07-25 LAB — PSA: PSA: 0.35

## 2017-07-25 LAB — HEMOGLOBIN A1C: HEMOGLOBIN A1C: 5.4

## 2017-07-25 LAB — HM HEPATITIS C SCREENING LAB: HM HEPATITIS C SCREENING: NEGATIVE

## 2017-07-25 LAB — HEPATIC FUNCTION PANEL
ALT: 47 — AB (ref 10–40)
AST: 44 — AB (ref 14–40)
Alkaline Phosphatase: 54 (ref 25–125)
BILIRUBIN, TOTAL: 0.3

## 2017-07-25 LAB — MICROALBUMIN, URINE: MICROALB UR: 14

## 2017-07-25 LAB — HM HIV SCREENING LAB: HM HIV SCREENING: NEGATIVE

## 2017-09-29 ENCOUNTER — Telehealth: Payer: Self-pay | Admitting: Internal Medicine

## 2017-09-29 NOTE — Telephone Encounter (Signed)
Dr Tami Ribas has requested an appointment to establish care/CPE.  Please give hm Friday Feb 22 at 4:30

## 2017-09-29 NOTE — Telephone Encounter (Signed)
Per Dr. Derrel Nip the pt is aware of his appt date and time.

## 2017-09-29 NOTE — Telephone Encounter (Signed)
The patient has been put on Dr. Lupita Dawn schedule. Do I need to notify the patient?

## 2017-10-10 DIAGNOSIS — H5213 Myopia, bilateral: Secondary | ICD-10-CM | POA: Diagnosis not present

## 2017-10-31 ENCOUNTER — Encounter: Payer: Self-pay | Admitting: Internal Medicine

## 2017-10-31 ENCOUNTER — Ambulatory Visit (INDEPENDENT_AMBULATORY_CARE_PROVIDER_SITE_OTHER): Payer: 59 | Admitting: Internal Medicine

## 2017-10-31 VITALS — BP 140/82 | HR 72 | Temp 98.2°F | Resp 14 | Ht 72.0 in | Wt 178.8 lb

## 2017-10-31 DIAGNOSIS — Z87312 Personal history of (healed) stress fracture: Secondary | ICD-10-CM | POA: Diagnosis not present

## 2017-10-31 DIAGNOSIS — Z86010 Personal history of colonic polyps: Secondary | ICD-10-CM | POA: Diagnosis not present

## 2017-10-31 DIAGNOSIS — E785 Hyperlipidemia, unspecified: Secondary | ICD-10-CM

## 2017-10-31 DIAGNOSIS — Z0000 Encounter for general adult medical examination without abnormal findings: Secondary | ICD-10-CM | POA: Diagnosis not present

## 2017-11-01 ENCOUNTER — Encounter: Payer: Self-pay | Admitting: Internal Medicine

## 2017-11-01 DIAGNOSIS — Z0000 Encounter for general adult medical examination without abnormal findings: Secondary | ICD-10-CM | POA: Insufficient documentation

## 2017-11-01 DIAGNOSIS — Z87312 Personal history of (healed) stress fracture: Secondary | ICD-10-CM | POA: Insufficient documentation

## 2017-11-01 DIAGNOSIS — E785 Hyperlipidemia, unspecified: Secondary | ICD-10-CM | POA: Insufficient documentation

## 2017-11-01 NOTE — Progress Notes (Signed)
Patient ID: Jose Boyer, male    DOB: 05/21/1966  Age: 52 y.o. MRN: [id number]  The patient is here  Jose Boyer an annual preventive examination.and to establish care The risk factors are reflected in the social history.  The roster of all physicians providing medical care to patient - is listed in the Snapshot section of the chart.  Activities of daily living:  The patient is 100% independent in all ADLs: dressing, toileting, feeding as well as independent mobility  Home safety : The patient has smoke detectors in the home. They wear seatbelts.  There are no firearms at home. There is no violence in the home.   There is no risks for hepatitis, STDs or HIV. There is no   history of blood transfusion. They have no travel history to infectious disease endemic areas of the world.  The patient has seen their dentist in the last six month. They have seen their eye doctor in the last year. They admit to slight hearing difficulty with regard to whispered voices and some television programs.  They have deferred audiologic testing in the last year.  They do not  have excessive sun exposure. Discussed the need for sun protection: hats, long sleeves and use of sunscreen if there is significant sun exposure.   Diet: the importance of a healthy diet is discussed. They do have a healthy diet.  The benefits of regular aerobic exercise were discussed. He exercises 6 days per week 60 minutes.   Depression screen: there are no signs or vegative symptoms of depression- irritability, change in appetite, anhedonia, sadness/tearfullness.  Cognitive assessment: the patient manages all their financial and personal affairs and is actively engaged. They could relate day,date,year and events; recalled 2/3 objects at 3 minutes; performed clock-face test normally.  The following portions of the patient's history were reviewed and updated as appropriate: allergies, current medications, past family history, past medical  history,  past surgical history, past social history  and problem list.  Visual acuity was not assessed per patient preference since she has regular follow up with her ophthalmologist. Hearing and body mass index were assessed and reviewed.   During the course of the visit the patient was educated and counseled about appropriate screening and preventive services including : fall prevention , diabetes screening, nutrition counseling, colorectal cancer screening, and recommended immunizations.    CC: The primary encounter diagnosis was History of colon polyps. Diagnoses of Hyperlipidemia LDL goal <130, History of vertebral stress fracture, and Encounter for preventive health examination were also pertinent to this visit.  History Jose Boyer has a past medical history of Closed T12 fracture ([place]) (2005), Contact lens/glasses fitting, GERD (gastroesophageal reflux disease), History of migraine, Hyperlipemia, Impingement syndrome of right shoulder (08/28/2012), and Right rotator cuff tear (08/28/2012).   He has a past surgical history that includes Appendectomy (4/13); Upper gi endoscopy; Colonoscopy; and Shoulder arthroscopy with rotator cuff repair and subacromial decompression (08/28/2012).   His family history includes Coronary artery disease (age of onset: 42) in his father.He reports that  has never smoked. he has never used smokeless tobacco. He reports that he drinks about 8.4 oz of alcohol per week. He reports that he does not use drugs.  Outpatient Medications Prior to Visit  Medication Sig Dispense Refill  . aspirin 325 MG tablet Take 325 mg by mouth daily.    Marland Kitchen LIVALO 2 MG TABS   1  . omeprazole (PRILOSEC) 40 MG capsule Take 40 mg by mouth every evening.    Marland Kitchen  methocarbamol (ROBAXIN) 500 MG tablet Take 1 tablet (500 mg total) by mouth 4 (four) times daily. (Patient not taking: Reported on 10/31/2017) 75 tablet 1  . oxyCODONE-acetaminophen (PERCOCET) 10-325 MG per tablet Take 1-2 tablets by  mouth every 6 (six) hours as needed for pain. MAXIMUM TOTAL ACETAMINOPHEN DOSE IS 4000 MG PER DAY (Patient not taking: Reported on 10/31/2017) 75 tablet 0  . promethazine (PHENERGAN) 25 MG tablet Take 1 tablet (25 mg total) by mouth every 6 (six) hours as needed for nausea. (Patient not taking: Reported on 10/31/2017) 30 tablet 0  . rosuvastatin (CRESTOR) 20 MG tablet Take 20 mg by mouth every evening.    . sennosides-docusate sodium (SENOKOT-S) 8.6-50 MG tablet Take 1 tablet by mouth daily. (Patient not taking: Reported on 10/31/2017) 30 tablet 1   No facility-administered medications prior to visit.     Review of Systems   Patient denies headache, fevers, malaise, unintentional weight loss, skin rash, eye pain, sinus congestion and sinus pain, sore throat, dysphagia,  hemoptysis , cough, dyspnea, wheezing, chest pain, palpitations, orthopnea, edema, abdominal pain, nausea, melena, diarrhea, constipation, flank pain, dysuria, hematuria, urinary  Frequency, nocturia, numbness, tingling, seizures,  Focal weakness, Loss of consciousness,  Tremor, insomnia, depression, anxiety, and suicidal ideation.     Objective:  BP 140/82 (BP Location: Left Arm, Patient Position: Sitting, Cuff Size: Normal)   Pulse 72   Temp 98.2 F (36.8 C) (Oral)   Resp 14   Ht 6' (1.829 m)   Wt 178 lb 12.8 oz (81.1 kg)   SpO2 99%   BMI 24.25 kg/m   Physical Exam   General appearance: alert, cooperative and appears stated age Ears: normal TM's and external ear canals both ears Throat: lips, mucosa, and tongue normal; teeth and gums normal Neck: no adenopathy, no carotid bruit, supple, symmetrical, trachea midline and thyroid not enlarged, symmetric, no tenderness/mass/nodules Back: symmetric, no curvature. ROM normal. No CVA tenderness. Lungs: clear to auscultation bilaterally Heart: regular rate and rhythm, S1, S2 normal, no murmur, click, rub or gallop Abdomen: soft, non-tender; bowel sounds normal; no masses,  no  organomegaly Pulses: 2+ and symmetric Skin: Skin color, texture, turgor normal. No rashes or lesions Lymph nodes: Cervical, supraclavicular, and axillary nodes normal.  Assessment & Plan:   Problem List Items Addressed This Visit    Hyperlipidemia LDL goal <130    Managed with pituvastatin.  Outside labs reviewed and at goal.       Relevant Medications   LIVALO 2 MG TABS   aspirin 325 MG tablet   History of vertebral stress fracture   Encounter for preventive health examination    Annual comprehensive preventive exam was done as well as an evaluation and management of acute and chronic conditions .  During the course of the visit the patient was educated and counseled about appropriate screening and preventive services including :  diabetes screening, lipid analysis with projected  10 year  risk for CAD , nutrition counseling, prostate and colorectal cancer screening, and recommended immunizations.  Printed recommendations for health maintenance screenings was given.        Other Visit Diagnoses    History of colon polyps    -  Primary   Relevant Orders   Ambulatory referral to Gastroenterology      I have discontinued Freda Munro T. Wailes's rosuvastatin, oxyCODONE-acetaminophen, methocarbamol, promethazine, and sennosides-docusate sodium. I am also having him maintain his omeprazole, LIVALO, and aspirin.  No orders of the defined types were placed  in this encounter.   Medications Discontinued During This Encounter  Medication Reason  . methocarbamol (ROBAXIN) 500 MG tablet Patient has not taken in last 30 days  . oxyCODONE-acetaminophen (PERCOCET) 10-325 MG per tablet Patient has not taken in last 30 days  . promethazine (PHENERGAN) 25 MG tablet Patient has not taken in last 30 days  . rosuvastatin (CRESTOR) 20 MG tablet Patient has not taken in last 30 days  . sennosides-docusate sodium (SENOKOT-S) 8.6-50 MG tablet Patient has not taken in last 30 days    Follow-up: No  Follow-up on file.   Crecencio Mc, MD

## 2017-11-01 NOTE — Assessment & Plan Note (Signed)
Managed with pituvastatin.  Outside labs reviewed and at goal.

## 2017-11-01 NOTE — Assessment & Plan Note (Signed)

## 2017-11-20 ENCOUNTER — Other Ambulatory Visit: Payer: Self-pay | Admitting: Internal Medicine

## 2017-11-20 DIAGNOSIS — N410 Acute prostatitis: Secondary | ICD-10-CM

## 2017-11-20 DIAGNOSIS — Z87438 Personal history of other diseases of male genital organs: Secondary | ICD-10-CM | POA: Insufficient documentation

## 2017-11-20 MED ORDER — ONDANSETRON HCL 8 MG PO TABS: 8.0000 mg | ORAL_TABLET | Freq: Two times a day (BID) | ORAL | 0 refills | Status: DC

## 2017-11-20 MED ORDER — SULFAMETHOXAZOLE-TRIMETHOPRIM 800-160 MG PO TABS: 1.0000 | ORAL_TABLET | Freq: Two times a day (BID) | ORAL | 0 refills | Status: DC

## 2017-12-18 ENCOUNTER — Encounter: Payer: Self-pay | Admitting: Internal Medicine

## 2017-12-18 ENCOUNTER — Encounter: Payer: Self-pay | Admitting: *Deleted

## 2018-01-29 ENCOUNTER — Encounter: Payer: Self-pay | Admitting: *Deleted

## 2018-03-10 ENCOUNTER — Other Ambulatory Visit: Payer: Self-pay

## 2018-03-10 DIAGNOSIS — Z86010 Personal history of colonic polyps: Secondary | ICD-10-CM

## 2018-03-19 NOTE — Discharge Instructions (Signed)
General Anesthesia, Adult, Care After °These instructions provide you with information about caring for yourself after your procedure. Your health care provider may also give you more specific instructions. Your treatment has been planned according to current medical practices, but problems sometimes occur. Call your health care provider if you have any problems or questions after your procedure. °What can I expect after the procedure? °After the procedure, it is common to have: °· Vomiting. °· A sore throat. °· Mental slowness. ° °It is common to feel: °· Nauseous. °· Cold or shivery. °· Sleepy. °· Tired. °· Sore or achy, even in parts of your body where you did not have surgery. ° °Follow these instructions at home: °For at least 24 hours after the procedure: °· Do not: °? Participate in activities where you could fall or become injured. °? Drive. °? Use heavy machinery. °? Drink alcohol. °? Take sleeping pills or medicines that cause drowsiness. °? Make important decisions or sign legal documents. °? Take care of children on your own. °· Rest. °Eating and drinking °· If you vomit, drink water, juice, or soup when you can drink without vomiting. °· Drink enough fluid to keep your urine clear or pale yellow. °· Make sure you have little or no nausea before eating solid foods. °· Follow the diet recommended by your health care provider. °General instructions °· Have a responsible adult stay with you until you are awake and alert. °· Return to your normal activities as told by your health care provider. Ask your health care provider what activities are safe for you. °· Take over-the-counter and prescription medicines only as told by your health care provider. °· If you smoke, do not smoke without supervision. °· Keep all follow-up visits as told by your health care provider. This is important. °Contact a health care provider if: °· You continue to have nausea or vomiting at home, and medicines are not helpful. °· You  cannot drink fluids or start eating again. °· You cannot urinate after 8-12 hours. °· You develop a skin rash. °· You have fever. °· You have increasing redness at the site of your procedure. °Get help right away if: °· You have difficulty breathing. °· You have chest pain. °· You have unexpected bleeding. °· You feel that you are having a life-threatening or urgent problem. °This information is not intended to replace advice given to you by your health care provider. Make sure you discuss any questions you have with your health care provider. °Document Released: 12/02/2000 Document Revised: 01/29/2016 Document Reviewed: 08/10/2015 °Elsevier Interactive Patient Education © 2018 [employer] Inc. ° °

## 2018-03-20 DIAGNOSIS — R5383 Other fatigue: Secondary | ICD-10-CM | POA: Diagnosis not present

## 2018-03-23 ENCOUNTER — Encounter
Admission: RE | Disposition: A | Discharge status: Home / Self Care | Payer: Self-pay | Source: Ambulatory Visit | Attending: Gastroenterology

## 2018-03-23 ENCOUNTER — Ambulatory Visit
Admission: RE | Admit: 2018-03-23 | Discharge: 2018-03-23 | Disposition: A | Discharge status: Home / Self Care | Payer: 59 | Source: Ambulatory Visit | Attending: Gastroenterology | Admitting: Gastroenterology

## 2018-03-23 ENCOUNTER — Ambulatory Visit: Payer: 59 | Admitting: Anesthesiology

## 2018-03-23 DIAGNOSIS — K2960 Other gastritis without bleeding: Secondary | ICD-10-CM | POA: Diagnosis not present

## 2018-03-23 DIAGNOSIS — Z86010 Personal history of colonic polyps: Secondary | ICD-10-CM | POA: Insufficient documentation

## 2018-03-23 DIAGNOSIS — K219 Gastro-esophageal reflux disease without esophagitis: Secondary | ICD-10-CM | POA: Diagnosis not present

## 2018-03-23 DIAGNOSIS — K641 Second degree hemorrhoids: Secondary | ICD-10-CM | POA: Diagnosis not present

## 2018-03-23 DIAGNOSIS — Z7982 Long term (current) use of aspirin: Secondary | ICD-10-CM | POA: Diagnosis not present

## 2018-03-23 DIAGNOSIS — D120 Benign neoplasm of cecum: Secondary | ICD-10-CM | POA: Insufficient documentation

## 2018-03-23 DIAGNOSIS — Z1211 Encounter for screening for malignant neoplasm of colon: Secondary | ICD-10-CM | POA: Diagnosis not present

## 2018-03-23 DIAGNOSIS — Z79899 Other long term (current) drug therapy: Secondary | ICD-10-CM | POA: Insufficient documentation

## 2018-03-23 DIAGNOSIS — K319 Disease of stomach and duodenum, unspecified: Secondary | ICD-10-CM | POA: Insufficient documentation

## 2018-03-23 DIAGNOSIS — K2970 Gastritis, unspecified, without bleeding: Secondary | ICD-10-CM | POA: Diagnosis not present

## 2018-03-23 DIAGNOSIS — K2950 Unspecified chronic gastritis without bleeding: Secondary | ICD-10-CM | POA: Diagnosis not present

## 2018-03-23 DIAGNOSIS — E785 Hyperlipidemia, unspecified: Secondary | ICD-10-CM | POA: Diagnosis not present

## 2018-03-23 DIAGNOSIS — D126 Benign neoplasm of colon, unspecified: Secondary | ICD-10-CM

## 2018-03-23 DIAGNOSIS — R12 Heartburn: Secondary | ICD-10-CM | POA: Diagnosis not present

## 2018-03-23 HISTORY — PX: ESOPHAGOGASTRODUODENOSCOPY (EGD) WITH PROPOFOL: SHX5813

## 2018-03-23 HISTORY — PX: POLYPECTOMY: SHX5525

## 2018-03-23 HISTORY — PX: COLONOSCOPY WITH PROPOFOL: SHX5780

## 2018-03-23 SURGERY — COLONOSCOPY WITH PROPOFOL
Anesthesia: General | Site: Rectum | Wound class: Contaminated

## 2018-03-23 MED ORDER — GLYCOPYRROLATE 0.2 MG/ML IJ SOLN
INTRAMUSCULAR | Status: DC | PRN
  Administered 2018-03-23: 0.2 mg via INTRAVENOUS

## 2018-03-23 MED ORDER — ACETAMINOPHEN 160 MG/5ML PO SOLN: 325.0000 mg | Freq: Once | ORAL | Status: DC

## 2018-03-23 MED ORDER — SIMETHICONE 40 MG/0.6ML PO SUSP
ORAL | Status: DC | PRN
  Administered 2018-03-23: 3 mL

## 2018-03-23 MED ORDER — PROPOFOL 10 MG/ML IV BOLUS
INTRAVENOUS | Status: DC | PRN
  Administered 2018-03-23: 20 mg via INTRAVENOUS
  Administered 2018-03-23: 30 mg via INTRAVENOUS
  Administered 2018-03-23 (×2): 20 mg via INTRAVENOUS
  Administered 2018-03-23: 30 mg via INTRAVENOUS
  Administered 2018-03-23 (×3): 20 mg via INTRAVENOUS
  Administered 2018-03-23: 30 mg via INTRAVENOUS
  Administered 2018-03-23 (×3): 20 mg via INTRAVENOUS
  Administered 2018-03-23 (×2): 30 mg via INTRAVENOUS
  Administered 2018-03-23 (×2): 20 mg via INTRAVENOUS
  Administered 2018-03-23: 80 mg via INTRAVENOUS
  Administered 2018-03-23 (×2): 30 mg via INTRAVENOUS
  Administered 2018-03-23 (×2): 20 mg via INTRAVENOUS
  Administered 2018-03-23: 30 mg via INTRAVENOUS
  Administered 2018-03-23: 20 mg via INTRAVENOUS

## 2018-03-23 MED ORDER — ACETAMINOPHEN 325 MG PO TABS: 325.0000 mg | ORAL_TABLET | Freq: Once | ORAL | Status: DC

## 2018-03-23 MED ORDER — LIDOCAINE HCL (CARDIAC) PF 100 MG/5ML IV SOSY
PREFILLED_SYRINGE | INTRAVENOUS | Status: DC | PRN
  Administered 2018-03-23 (×2): 40 mg via INTRAVENOUS

## 2018-03-23 MED ORDER — LACTATED RINGERS IV SOLN
INTRAVENOUS | Status: DC
  Administered 2018-03-23 (×2): via INTRAVENOUS

## 2018-03-23 SURGICAL SUPPLY — 36 items
BALLN DILATOR 10-12 8 (BALLOONS)
BALLN DILATOR 12-15 8 (BALLOONS)
BALLN DILATOR 15-18 8 (BALLOONS)
BALLN DILATOR CRE 0-12 8 (BALLOONS)
BALLN DILATOR ESOPH 8 10 CRE (MISCELLANEOUS) IMPLANT
BALLOON DILATOR 12-15 8 (BALLOONS) IMPLANT
BALLOON DILATOR 15-18 8 (BALLOONS) IMPLANT
BALLOON DILATOR CRE 0-12 8 (BALLOONS) IMPLANT
BLOCK BITE 60FR ADLT L/F GRN (MISCELLANEOUS) ×3 IMPLANT
CANISTER SUCT 1200ML W/VALVE (MISCELLANEOUS) ×3 IMPLANT
CLIP HMST 235XBRD CATH ROT (MISCELLANEOUS) IMPLANT
CLIP RESOLUTION 360 11X235 (MISCELLANEOUS)
ELECT REM PT RETURN 9FT ADLT (ELECTROSURGICAL)
ELECTRODE REM PT RTRN 9FT ADLT (ELECTROSURGICAL) IMPLANT
FCP ESCP3.2XJMB 240X2.8X (MISCELLANEOUS)
FORCEPS BIOP RAD 4 LRG CAP 4 (CUTTING FORCEPS) ×3 IMPLANT
FORCEPS BIOP RJ4 240 W/NDL (MISCELLANEOUS)
FORCEPS ESCP3.2XJMB 240X2.8X (MISCELLANEOUS) IMPLANT
GOWN CVR UNV OPN BCK APRN NK (MISCELLANEOUS) ×4 IMPLANT
GOWN ISOL THUMB LOOP REG UNIV (MISCELLANEOUS) ×2
INJECTOR VARIJECT VIN23 (MISCELLANEOUS) IMPLANT
KIT DEFENDO VALVE AND CONN (KITS) IMPLANT
KIT ENDO PROCEDURE OLY (KITS) ×3 IMPLANT
MARKER SPOT ENDO TATTOO 5ML (MISCELLANEOUS) IMPLANT
PROBE APC STR FIRE (PROBE) IMPLANT
RETRIEVER NET PLAT FOOD (MISCELLANEOUS) IMPLANT
RETRIEVER NET ROTH 2.5X230 LF (MISCELLANEOUS) IMPLANT
SNARE SHORT THROW 13M SML OVAL (MISCELLANEOUS) IMPLANT
SNARE SHORT THROW 30M LRG OVAL (MISCELLANEOUS) IMPLANT
SNARE SNG USE RND 15MM (INSTRUMENTS) IMPLANT
SPOT EX ENDOSCOPIC TATTOO (MISCELLANEOUS)
SYR INFLATION 60ML (SYRINGE) IMPLANT
TRAP ETRAP POLY (MISCELLANEOUS) IMPLANT
VARIJECT INJECTOR VIN23 (MISCELLANEOUS)
WATER STERILE IRR 250ML POUR (IV SOLUTION) ×3 IMPLANT
WIRE CRE 18-20MM 8CM F G (MISCELLANEOUS) IMPLANT

## 2018-03-23 NOTE — H&P (Signed)
Jose Lame, MD [address] [place], [place] [address] Phone:[phone] Fax : [phone]  Primary Care Physician:  Jose Mc, MD Primary Gastroenterologist:  Dr. Allen Boyer  Pre-Procedure History & Physical: HPI:  Jose Boyer is a 52 y.o. male is here for an endoscopy and colonoscopy.   Past Medical History:  Diagnosis Date  . Closed T12 fracture ([place]) 2005  . Contact lens/glasses fitting    wears contacts or glasses  . GERD (gastroesophageal reflux disease)   . History of migraine   . Hyperlipemia   . Impingement syndrome of right shoulder 08/28/2012  . Right rotator cuff tear 08/28/2012    Past Surgical History:  Procedure Laterality Date  . APPENDECTOMY  4/13   arh  . COLONOSCOPY     x2  . SHOULDER ARTHROSCOPY WITH ROTATOR CUFF REPAIR AND SUBACROMIAL DECOMPRESSION  08/28/2012   Procedure: SHOULDER ARTHROSCOPY WITH ROTATOR CUFF REPAIR AND SUBACROMIAL DECOMPRESSION;  Surgeon: Jose Bridge, MD;  Location: [facility];  Service: Orthopedics;  Laterality: Right;  RIGHT SHOULDER DEBRIDEMENT LIMITED, ARTHROSCOPY SHJOULDER DECOMPRESSION SUBACROMIAL PARTIAL ACROMIOPLASTY WITH CORACOACROMIAL RELEASE, ARTHROSCOPY SHOULDER WITH ROTATOR CUFF REPAIR  . UPPER GI ENDOSCOPY     x2    Prior to Admission medications   Medication Sig Start Date End Date Taking? Authorizing Provider  aspirin 325 MG tablet Take 325 mg by mouth daily.   Yes [provider]  LIVALO 2 MG TABS  09/18/17  Yes [provider]  omeprazole (PRILOSEC) 40 MG capsule Take 40 mg by mouth every evening.   Yes [provider]  ondansetron (ZOFRAN) 8 MG tablet Take 1 tablet (8 mg total) by mouth 2 (two) times daily. Patient not taking: Reported on 03/20/2018 11/20/17   Jose Mc, MD  sulfamethoxazole-trimethoprim (BACTRIM DS,SEPTRA DS) 800-160 MG tablet Take 1 tablet by mouth 2 (two) times daily. Patient not taking: Reported on 03/20/2018 11/20/17    Jose Mc, MD    Allergies as of 03/10/2018  . (No Known Allergies)    Family History  Problem Relation Age of Onset  . Coronary artery disease Father 76       died at age 23    Social History   Socioeconomic History  . Marital status: Married    Spouse name: Jose Boyer  . Number of children: 3  . Years of education: 40  . Highest education level: Professional school degree (e.g., MD, DDS, DVM, JD)  Occupational History  . Occupation: Psychologist, sport and exercise  Social Needs  . Financial resource strain: Not on file  . Food insecurity:    Worry: Not on file    Inability: Not on file  . Transportation needs:    Medical: Not on file    Non-medical: Not on file  Tobacco Use  . Smoking status: Never Smoker  . Smokeless tobacco: Never Used  Substance and Sexual Activity  . Alcohol use: Yes    Alcohol/week: 2.4 oz    Types: 4 Shots of liquor per week    Comment: occ  . Drug use: No  . Sexual activity: Not on file  Lifestyle  . Physical activity:    Days per week: 6 days    Minutes per session: 60 min  . Stress: Only a little  Relationships  . Social connections:    Talks on phone: Not on file    Gets together: Not on file    Attends religious service: Not on file    Active  member of club or organization: Not on file    Attends meetings of clubs or organizations: Not on file    Relationship status: Not on file  . Intimate partner violence:    Fear of current or ex partner: Not on file    Emotionally abused: Not on file    Physically abused: Not on file    Forced sexual activity: Not on file  Other Topics Concern  . Not on file  Social History Narrative  . Not on file    Review of Systems: See HPI, otherwise negative ROS  Physical Exam: BP 125/73   Pulse 87   Temp 97.9 F (36.6 C) (Temporal)   Resp 17   Ht 6' (1.829 m)   Wt 169 lb (76.7 kg)   SpO2 100%   BMI 22.92 kg/m  General:   Alert,  pleasant and cooperative in NAD Head:  Normocephalic and  atraumatic. Neck:  Supple; no masses or thyromegaly. Lungs:  Clear throughout to auscultation.    Heart:  Regular rate and rhythm. Abdomen:  Soft, nontender and nondistended. Normal bowel sounds, without guarding, and without rebound.   Neurologic:  Alert and  oriented x4;  grossly normal neurologically.  Impression/Plan: Jose Boyer is here for an endoscopy and colonoscopy to be performed for history of colon polyps GERD.  Risks, benefits, limitations, and alternatives regarding  endoscopy and colonoscopy have been reviewed with the patient.  Questions have been answered.  All parties agreeable.   Jose Lame, MD  03/23/2018, 7:28 AM

## 2018-03-23 NOTE — Op Note (Signed)
[facility] Patient Name: Jose Boyer Procedure Date: 03/23/2018 8:09 AM MRN: [id number] Account #: [id number] Date of Birth: 01-11-1966 Admit Type: Outpatient Age: 52 Room: [facility] OR ROOM 01 Gender: Male Note Status: Finalized Procedure:            Colonoscopy Indications:          High risk colon cancer surveillance: Personal history                        of colonic polyps Providers:            Lucilla Lame MD, MD Referring MD:         Deborra Medina, MD (Referring MD) Medicines:            Propofol per Anesthesia Complications:        No immediate complications. Procedure:            Pre-Anesthesia Assessment:                       - Prior to the procedure, a History and Physical was                        performed, and patient medications and allergies were                        reviewed. The patient's tolerance of previous                        anesthesia was also reviewed. The risks and benefits of                        the procedure and the sedation options and risks were                        discussed with the patient. All questions were                        answered, and informed consent was obtained. Prior                        Anticoagulants: The patient has taken no previous                        anticoagulant or antiplatelet agents. ASA Grade                        Assessment: II - A patient with mild systemic disease.                        After reviewing the risks and benefits, the patient was                        deemed in satisfactory condition to undergo the                        procedure.                       After obtaining informed consent, the colonoscope was  passed under direct vision. Throughout the procedure,                        the patient's blood pressure, pulse, and oxygen                        saturations were monitored continuously. The was                        introduced  through the anus and advanced to the the                        cecum, identified by appendiceal orifice and ileocecal                        valve. The colonoscopy was performed without                        difficulty. The patient tolerated the procedure well.                        The quality of the bowel preparation was excellent. Findings:      The perianal and digital rectal examinations were normal.      Two sessile polyps were found in the cecum. The polyps were 1 to 2 mm in       size. These polyps were removed with a cold biopsy forceps. Resection       and retrieval were complete.      Non-bleeding internal hemorrhoids were found during retroflexion. The       hemorrhoids were Grade II (internal hemorrhoids that prolapse but reduce       spontaneously). Impression:           - Two 1 to 2 mm polyps in the cecum, removed with a                        cold biopsy forceps. Resected and retrieved.                       - Non-bleeding internal hemorrhoids. Recommendation:       - Discharge patient to home.                       - Resume previous diet.                       - Continue present medications.                       - Repeat colonoscopy in 5 years for surveillance. Procedure Code(s):    --- Professional ---                       [phone], Colonoscopy, flexible; with biopsy, single or                        multiple Diagnosis Code(s):    --- Professional ---                       Z86.010, Personal history of colonic polyps  D12.0, Benign neoplasm of cecum CPT copyright 2017 American Medical Association. All rights reserved. The codes documented in this report are preliminary and upon coder review may  be revised to meet current compliance requirements. Lucilla Lame MD, MD 03/23/2018 8:28:55 AM This report has been signed electronically. Number of Addenda: 0 Note Initiated On: 03/23/2018 8:09 AM Scope Withdrawal Time: 0 hours 12 minutes 28 seconds   Total Procedure Duration: 0 hours 13 minutes 57 seconds       [facility]

## 2018-03-23 NOTE — Anesthesia Postprocedure Evaluation (Signed)
Anesthesia Post Note  Patient: Jose Boyer  Procedure(s) Performed: COLONOSCOPY WITH PROPOFOL (N/A ) ESOPHAGOGASTRODUODENOSCOPY (EGD) WITH PROPOFOL (N/A ) POLYPECTOMY (N/A Rectum)  Patient location during evaluation: PACU Anesthesia Type: General Level of consciousness: awake and alert and oriented Pain management: satisfactory to patient Vital Signs Assessment: post-procedure vital signs reviewed and stable Respiratory status: spontaneous breathing, nonlabored ventilation and respiratory function stable Cardiovascular status: blood pressure returned to baseline and stable Postop Assessment: Adequate PO intake and No signs of nausea or vomiting Anesthetic complications: no    Raliegh Ip

## 2018-03-23 NOTE — Transfer of Care (Signed)
Immediate Anesthesia Transfer of Care Note  Patient: Jose Boyer  Procedure(s) Performed: COLONOSCOPY WITH PROPOFOL (N/A ) ESOPHAGOGASTRODUODENOSCOPY (EGD) WITH PROPOFOL (N/A )  Patient Location: PACU  Anesthesia Type: General  Level of Consciousness: awake, alert  and patient cooperative  Airway and Oxygen Therapy: Patient Spontanous Breathing and Patient connected to supplemental oxygen  Post-op Assessment: Post-op Vital signs reviewed, Patient's Cardiovascular Status Stable, Respiratory Function Stable, Patent Airway and No signs of Nausea or vomiting  Post-op Vital Signs: Reviewed and stable  Complications: No apparent anesthesia complications

## 2018-03-23 NOTE — Anesthesia Preprocedure Evaluation (Signed)
Anesthesia Evaluation  Patient identified by MRN, date of birth, ID band Patient awake    Reviewed: Allergy & Precautions, H&P , NPO status , Patient's Chart, lab work & pertinent test results  Airway Mallampati: II  TM Distance: >3 FB Neck ROM: full    Dental no notable dental hx.    Pulmonary    Pulmonary exam normal breath sounds clear to auscultation       Cardiovascular Normal cardiovascular exam Rhythm:regular Rate:Normal     Neuro/Psych    GI/Hepatic GERD  ,  Endo/Other    Renal/GU      Musculoskeletal   Abdominal   Peds  Hematology   Anesthesia Other Findings   Reproductive/Obstetrics                             Anesthesia Physical Anesthesia Plan  ASA: II  Anesthesia Plan: General   Post-op Pain Management:    Induction:   PONV Risk Score and Plan: 2 and Propofol infusion and Treatment may vary due to age or medical condition  Airway Management Planned: Natural Airway  Additional Equipment:   Intra-op Plan:   Post-operative Plan:   Informed Consent: I have reviewed the patients History and Physical, chart, labs and discussed the procedure including the risks, benefits and alternatives for the proposed anesthesia with the patient or authorized representative who has indicated his/her understanding and acceptance.     Plan Discussed with: CRNA  Anesthesia Plan Comments:         Anesthesia Quick Evaluation

## 2018-03-23 NOTE — Anesthesia Procedure Notes (Signed)
Performed by: Adryen Cookson, CRNA Pre-anesthesia Checklist: Patient identified, Emergency Drugs available, Suction available, Timeout performed and Patient being monitored Patient Re-evaluated:Patient Re-evaluated prior to induction Oxygen Delivery Method: Nasal cannula Placement Confirmation: positive ETCO2       

## 2018-03-23 NOTE — Op Note (Signed)
[facility]enter[facility] Patient Name: Jose Boyer Procedure Date: 03/23/2018 7:25 AM MRN: [id number] Account #: [id number] Date of Birth: September 17, 1965 Admit Type: Outpatient Age: 52 Room: [facility] OR ROOM 01 Gender: Male Note Status: Finalized Procedure:            Upper GI endoscopy Indications:          Heartburn Providers:            Lucilla Lame MD, MD Referring MD:         Deborra Medina, MD (Referring MD) Medicines:            Propofol per Anesthesia Complications:        No immediate complications. Procedure:            Pre-Anesthesia Assessment:                       - Prior to the procedure, a History and Physical was                        performed, and patient medications and allergies were                        reviewed. The patient's tolerance of previous                        anesthesia was also reviewed. The risks and benefits of                        the procedure and the sedation options and risks were                        discussed with the patient. All questions were                        answered, and informed consent was obtained. Prior                        Anticoagulants: The patient has taken no previous                        anticoagulant or antiplatelet agents. ASA Grade                        Assessment: II - A patient with mild systemic disease.                        After reviewing the risks and benefits, the patient was                        deemed in satisfactory condition to undergo the                        procedure.                       After obtaining informed consent, the endoscope was                        passed under direct vision. Throughout the procedure,  the patient's blood pressure, pulse, and oxygen                        saturations were monitored continuously. The was                        introduced through the mouth, and advanced to the                        second part of  duodenum. The upper GI endoscopy was                        accomplished without difficulty. The patient tolerated                        the procedure well. Findings:      A small hiatal hernia was present.      Localized minimal inflammation characterized by erythema was found in       the gastric antrum. Biopsies were taken with a cold forceps for       histology.      The examined duodenum was normal. Impression:           - Small hiatal hernia.                       - Gastritis. Biopsied.                       - Normal examined duodenum. Recommendation:       - Perform a colonoscopy today. Procedure Code(s):    --- Professional ---                       [id number], Esophagogastroduodenoscopy, flexible, transoral;                        with biopsy, single or multiple Diagnosis Code(s):    --- Professional ---                       R12, Heartburn                       K29.70, Gastritis, unspecified, without bleeding CPT copyright 2017 American Medical Association. All rights reserved. The codes documented in this report are preliminary and upon coder review may  be revised to meet current compliance requirements. Lucilla Lame MD, MD 03/23/2018 8:09:30 AM This report has been signed electronically. Number of Addenda: 0 Note Initiated On: 03/23/2018 7:25 AM      [facility]

## 2018-03-24 ENCOUNTER — Encounter: Payer: Self-pay | Admitting: Gastroenterology

## 2018-03-26 DIAGNOSIS — D126 Benign neoplasm of colon, unspecified: Secondary | ICD-10-CM

## 2018-03-26 LAB — SURGICAL PATHOLOGY

## 2018-03-27 ENCOUNTER — Encounter: Payer: Self-pay | Admitting: Gastroenterology

## 2018-06-10 DIAGNOSIS — M25511 Pain in right shoulder: Secondary | ICD-10-CM | POA: Diagnosis not present

## 2018-06-10 DIAGNOSIS — M542 Cervicalgia: Secondary | ICD-10-CM | POA: Diagnosis not present

## 2018-09-15 ENCOUNTER — Encounter: Payer: Self-pay | Admitting: Internal Medicine

## 2018-09-15 DIAGNOSIS — D229 Melanocytic nevi, unspecified: Secondary | ICD-10-CM | POA: Diagnosis not present

## 2018-09-15 DIAGNOSIS — L814 Other melanin hyperpigmentation: Secondary | ICD-10-CM | POA: Diagnosis not present

## 2018-09-15 DIAGNOSIS — L578 Other skin changes due to chronic exposure to nonionizing radiation: Secondary | ICD-10-CM | POA: Diagnosis not present

## 2018-09-15 DIAGNOSIS — D1800 Hemangioma unspecified site: Secondary | ICD-10-CM | POA: Diagnosis not present

## 2018-09-15 DIAGNOSIS — Z1283 Encounter for screening for malignant neoplasm of skin: Secondary | ICD-10-CM | POA: Diagnosis not present

## 2018-09-15 DIAGNOSIS — C44319 Basal cell carcinoma of skin of other parts of face: Secondary | ICD-10-CM | POA: Diagnosis not present

## 2018-09-15 DIAGNOSIS — L820 Inflamed seborrheic keratosis: Secondary | ICD-10-CM | POA: Diagnosis not present

## 2018-09-15 DIAGNOSIS — D485 Neoplasm of uncertain behavior of skin: Secondary | ICD-10-CM | POA: Diagnosis not present

## 2018-09-15 DIAGNOSIS — D225 Melanocytic nevi of trunk: Secondary | ICD-10-CM | POA: Diagnosis not present

## 2018-09-15 DIAGNOSIS — L918 Other hypertrophic disorders of the skin: Secondary | ICD-10-CM | POA: Diagnosis not present

## 2018-09-15 DIAGNOSIS — L821 Other seborrheic keratosis: Secondary | ICD-10-CM | POA: Diagnosis not present

## 2018-09-15 HISTORY — DX: Melanocytic nevi, unspecified: D22.9

## 2018-10-20 DIAGNOSIS — C44319 Basal cell carcinoma of skin of other parts of face: Secondary | ICD-10-CM | POA: Diagnosis not present

## 2018-10-20 DIAGNOSIS — C4491 Basal cell carcinoma of skin, unspecified: Secondary | ICD-10-CM

## 2018-10-20 HISTORY — DX: Basal cell carcinoma of skin, unspecified: C44.91

## 2018-10-27 DIAGNOSIS — R5383 Other fatigue: Secondary | ICD-10-CM | POA: Diagnosis not present

## 2018-10-27 LAB — BASIC METABOLIC PANEL
BUN: 15 (ref 4–21)
CREATININE: 1 (ref 0.6–1.3)
Sodium: 144 (ref 137–147)

## 2018-10-27 LAB — LIPID PANEL
Cholesterol: 241 — AB (ref 0–200)
HDL: 92 — AB (ref 35–70)
LDL Cholesterol: 129
Triglycerides: 98 (ref 40–160)

## 2018-10-27 LAB — HEPATIC FUNCTION PANEL
ALK PHOS: 52 (ref 25–125)
ALT: 35 (ref 10–40)
AST: 44 — AB (ref 14–40)
Bilirubin, Total: 0.6

## 2018-10-27 LAB — TSH: TSH: 3.01 (ref 0.41–5.90)

## 2018-10-29 LAB — CBC AND DIFFERENTIAL
HEMOGLOBIN: 14.9 (ref 13.5–17.5)
PLATELETS: 218 (ref 150–399)
WBC: 4.1

## 2018-11-04 ENCOUNTER — Ambulatory Visit (INDEPENDENT_AMBULATORY_CARE_PROVIDER_SITE_OTHER): Payer: 59 | Admitting: Internal Medicine

## 2018-11-04 ENCOUNTER — Encounter: Payer: Self-pay | Admitting: Internal Medicine

## 2018-11-04 VITALS — BP 120/82 | HR 75 | Temp 98.5°F | Resp 14 | Ht 72.0 in | Wt 175.0 lb

## 2018-11-04 DIAGNOSIS — Z87438 Personal history of other diseases of male genital organs: Secondary | ICD-10-CM

## 2018-11-04 DIAGNOSIS — Z0000 Encounter for general adult medical examination without abnormal findings: Secondary | ICD-10-CM

## 2018-11-04 DIAGNOSIS — E785 Hyperlipidemia, unspecified: Secondary | ICD-10-CM | POA: Diagnosis not present

## 2018-11-04 MED ORDER — TRAZODONE HCL 50 MG PO TABS: 25.0000 mg | ORAL_TABLET | Freq: Every evening | ORAL | 3 refills | Status: DC | PRN

## 2018-11-04 NOTE — Progress Notes (Signed)
Patient ID: Jose Boyer, male    DOB: 27-May-1966  Age: 53 y.o. MRN: [id number]  The patient is here for annual preventive  examination and management of other chronic and acute problems.   The risk factors are reflected in the social history.  Tubular adenomas on colonoscopy 2019;  5 yr  follow up advised Basal cell CA and dysplastic  [place]  Removed by  Jose Boyer I 6 month follow  Saw Jose Boyer  for elevated PSA;  Treated for prostatitis with  beer and water.    The roster of all physicians providing medical care to patient - is listed in the Snapshot section of the chart.  Activities of daily living:  The patient is 100% independent in all ADLs: dressing, toileting, feeding as well as independent mobility  Home safety : The patient has smoke detectors in the home. They wear seatbelts.  There are no firearms at home. There is no violence in the home.   There is no risks for hepatitis, STDs or HIV. There is no   history of blood transfusion. They have no travel history to infectious disease endemic areas of the world.  The patient has seen their dentist in the last six month. They have seen their eye doctor in the last year. They admit to slight hearing difficulty with regard to whispered voices and some television programs.  They have deferred audiologic testing in the last year.  They do not  have excessive sun exposure. Discussed the need for sun protection: hats, long sleeves and use of sunscreen if there is significant sun exposure.   Diet: the importance of a healthy diet is discussed. They do have a healthy diet.  The benefits of regular aerobic exercise were discussed. He works out vigorously 6 to 7 days per week .   Depression screen: there are no signs or vegative symptoms of depression- irritability, change in appetite, anhedonia, sadness/tearfullness.   The following portions of the patient's history were reviewed and updated as appropriate: allergies, current  medications, past family history, past medical history,  past surgical history, past social history  and problem list.  Visual acuity was not assessed per patient preference since she has regular follow up with her ophthalmologist. Hearing and body mass index were assessed and reviewed.   During the course of the visit the patient was educated and counseled about appropriate screening and preventive services including : fall prevention , diabetes screening, nutrition counseling, colorectal cancer screening, and recommended immunizations.    CC: Diagnoses of Encounter for preventive health examination, History of prostatitis, and Hyperlipidemia LDL goal <130 were pertinent to this visit.  History Jose Boyer has a past medical history of Closed T12 fracture ([place]) (2005), Contact lens/glasses fitting, GERD (gastroesophageal reflux disease), History of migraine, Hyperlipemia, Impingement syndrome of right shoulder (08/28/2012), and Right rotator cuff tear (08/28/2012).   He has a past surgical history that includes Appendectomy (4/13); Upper gi endoscopy; Colonoscopy; Shoulder arthroscopy with rotator cuff repair and subacromial decompression (08/28/2012); Colonoscopy with propofol (N/A, 03/23/2018); Esophagogastroduodenoscopy (egd) with propofol (N/A, 03/23/2018); and polypectomy (N/A, 03/23/2018).   His family history includes Coronary artery disease (age of onset: 28) in his father.He reports that he has never smoked. He has never used smokeless tobacco. He reports current alcohol use of about 4.0 standard drinks of alcohol per week. He reports that he does not use drugs.  Outpatient Medications Prior to Visit  Medication Sig Dispense Refill  . aspirin 325 MG tablet Take 325 mg  by mouth daily.    Marland Kitchen LIVALO 2 MG TABS   1  . omeprazole (PRILOSEC) 40 MG capsule Take 40 mg by mouth every evening.    . ondansetron (ZOFRAN) 8 MG tablet Take 1 tablet (8 mg total) by mouth 2 (two) times daily. (Patient not  taking: Reported on 03/20/2018) 30 tablet 0  . sulfamethoxazole-trimethoprim (BACTRIM DS,SEPTRA DS) 800-160 MG tablet Take 1 tablet by mouth 2 (two) times daily. (Patient not taking: Reported on 03/20/2018) 28 tablet 0   No facility-administered medications prior to visit.     Review of Systems   Patient denies headache, fevers, malaise, unintentional weight loss, skin rash, eye pain, sinus congestion and sinus pain, sore throat, dysphagia,  hemoptysis , cough, dyspnea, wheezing, chest pain, palpitations, orthopnea, edema, abdominal pain, nausea, melena, diarrhea, constipation, flank pain, dysuria, hematuria, urinary  Frequency, nocturia, numbness, tingling, seizures,  Focal weakness, Loss of consciousness,  Tremor, insomnia, depression, anxiety, and suicidal ideation.     Objective:  BP 120/82 (BP Location: Left Arm, Patient Position: Sitting, Cuff Size: Normal)   Pulse 75   Temp 98.5 F (36.9 C) (Oral)   Resp 14   Ht 6' (1.829 m)   Wt 175 lb (79.4 kg)   SpO2 98%   BMI 23.73 kg/m   Physical Exam   General appearance: alert, cooperative and appears stated age Ears: normal TM's and external ear canals both ears Throat: lips, mucosa, and tongue normal; teeth and gums normal Neck: no adenopathy, no carotid bruit, supple, symmetrical, trachea midline and thyroid not enlarged, symmetric, no tenderness/mass/nodules Back: symmetric, no curvature. ROM normal. No CVA tenderness. Lungs: clear to auscultation bilaterally Heart: regular rate and rhythm, S1, S2 normal, no murmur, click, rub or gallop Abdomen: soft, non-tender; bowel sounds normal; no masses,  no organomegaly Pulses: 2+ and symmetric Skin: Skin color, texture, turgor normal. No rashes or lesions Lymph nodes: Cervical, supraclavicular, and axillary nodes normal.   Assessment & Plan:   Problem List Items Addressed This Visit    Hyperlipidemia LDL goal <130    Managed with pituvastatin.  Outside labs reviewed and at goal. 10   Yr risk reduced to 8%       History of prostatitis    Presented asymptomatically with ah elevated PSA.  He was treated with increased water and beer intake to resolution  Lab Results  Component Value Date   PSA 0.35 07/25/2017   PSA 0.4 05/31/2017         Encounter for preventive health examination    Annual comprehensive preventive exam was done a.  During the course of the visit the patient was educated and counseled about appropriate screening and preventive services including :  diabetes screening, lipid analysis with projected  10 year  risk for CAD , nutrition counseling, prostate and  colorectal cancer screening, and recommended immunizations.  Printed recommendations for health maintenance screenings was given         I have discontinued Freda Munro T. Lorge's sulfamethoxazole-trimethoprim and ondansetron. I am also having him start on traZODone. Additionally, I am having him maintain his omeprazole, LIVALO, and aspirin.  Meds ordered this encounter  Medications  . traZODone (DESYREL) 50 MG tablet    Sig: Take 0.5-1 tablets (25-50 mg total) by mouth at bedtime as needed for sleep.    Dispense:  30 tablet    Refill:  3    Medications Discontinued During This Encounter  Medication Reason  . ondansetron (ZOFRAN) 8 MG tablet Patient has  not taken in last 30 days  . sulfamethoxazole-trimethoprim (BACTRIM DS,SEPTRA DS) 800-160 MG tablet Patient has not taken in last 30 days    Follow-up: No follow-ups on file.   Crecencio Mc, MD

## 2018-11-05 NOTE — Assessment & Plan Note (Signed)
Managed with pituvastatin.  Outside labs reviewed and at goal. 10  Yr risk reduced to 8%

## 2018-11-05 NOTE — Assessment & Plan Note (Signed)
Presented asymptomatically with ah elevated PSA.  He was treated with increased water and beer intake to resolution  Lab Results  Component Value Date   PSA 0.35 07/25/2017   PSA 0.4 05/31/2017

## 2018-11-05 NOTE — Assessment & Plan Note (Signed)
Annual comprehensive preventive exam was done a.  During the course of the visit the patient was educated and counseled about appropriate screening and preventive services including :  diabetes screening, lipid analysis with projected  10 year  risk for CAD , nutrition counseling, prostate and  colorectal cancer screening, and recommended immunizations.  Printed recommendations for health maintenance screenings was given

## 2019-04-20 DIAGNOSIS — L570 Actinic keratosis: Secondary | ICD-10-CM | POA: Diagnosis not present

## 2019-04-20 DIAGNOSIS — L820 Inflamed seborrheic keratosis: Secondary | ICD-10-CM | POA: Diagnosis not present

## 2019-04-20 DIAGNOSIS — D2262 Melanocytic nevi of left upper limb, including shoulder: Secondary | ICD-10-CM | POA: Diagnosis not present

## 2019-04-20 DIAGNOSIS — Z1283 Encounter for screening for malignant neoplasm of skin: Secondary | ICD-10-CM | POA: Diagnosis not present

## 2019-04-20 DIAGNOSIS — D225 Melanocytic nevi of trunk: Secondary | ICD-10-CM | POA: Diagnosis not present

## 2019-04-20 DIAGNOSIS — L720 Epidermal cyst: Secondary | ICD-10-CM | POA: Diagnosis not present

## 2019-04-20 DIAGNOSIS — D2372 Other benign neoplasm of skin of left lower limb, including hip: Secondary | ICD-10-CM | POA: Diagnosis not present

## 2019-04-20 DIAGNOSIS — D2261 Melanocytic nevi of right upper limb, including shoulder: Secondary | ICD-10-CM | POA: Diagnosis not present

## 2019-04-20 DIAGNOSIS — L821 Other seborrheic keratosis: Secondary | ICD-10-CM | POA: Diagnosis not present

## 2019-06-14 DIAGNOSIS — M461 Sacroiliitis, not elsewhere classified: Secondary | ICD-10-CM | POA: Diagnosis not present

## 2019-06-14 DIAGNOSIS — M545 Low back pain: Secondary | ICD-10-CM | POA: Diagnosis not present

## 2019-11-04 DIAGNOSIS — L820 Inflamed seborrheic keratosis: Secondary | ICD-10-CM | POA: Diagnosis not present

## 2019-11-04 DIAGNOSIS — D2371 Other benign neoplasm of skin of right lower limb, including hip: Secondary | ICD-10-CM | POA: Diagnosis not present

## 2019-11-04 DIAGNOSIS — D229 Melanocytic nevi, unspecified: Secondary | ICD-10-CM | POA: Diagnosis not present

## 2019-11-04 DIAGNOSIS — L719 Rosacea, unspecified: Secondary | ICD-10-CM | POA: Diagnosis not present

## 2019-11-04 DIAGNOSIS — D2230 Melanocytic nevi of unspecified part of face: Secondary | ICD-10-CM | POA: Diagnosis not present

## 2019-11-04 DIAGNOSIS — D1801 Hemangioma of skin and subcutaneous tissue: Secondary | ICD-10-CM | POA: Diagnosis not present

## 2019-11-04 DIAGNOSIS — D225 Melanocytic nevi of trunk: Secondary | ICD-10-CM | POA: Diagnosis not present

## 2019-11-04 DIAGNOSIS — Z1283 Encounter for screening for malignant neoplasm of skin: Secondary | ICD-10-CM | POA: Diagnosis not present

## 2019-11-04 DIAGNOSIS — L578 Other skin changes due to chronic exposure to nonionizing radiation: Secondary | ICD-10-CM | POA: Diagnosis not present

## 2019-11-15 DIAGNOSIS — R5383 Other fatigue: Secondary | ICD-10-CM | POA: Diagnosis not present

## 2019-11-15 LAB — HEPATIC FUNCTION PANEL
ALT: 35 (ref 10–40)
AST: 35 (ref 14–40)
Alkaline Phosphatase: 67 (ref 25–125)
Bilirubin, Total: 0.4

## 2019-11-15 LAB — BASIC METABOLIC PANEL
BUN: 18 (ref 4–21)
Chloride: 100 (ref 99–108)
Creatinine: 1.2 (ref 0.6–1.3)
Glucose: 99
Potassium: 4.8 (ref 3.4–5.3)
Sodium: 141 (ref 137–147)

## 2019-11-15 LAB — LIPID PANEL
Cholesterol: 219 — AB (ref 0–200)
HDL: 80 — AB (ref 35–70)
LDL Cholesterol: 121
Triglycerides: 102 (ref 40–160)

## 2019-11-15 LAB — COMPREHENSIVE METABOLIC PANEL
Albumin: 4.9 (ref 3.5–5.0)
Calcium: 10.1 (ref 8.7–10.7)

## 2019-11-15 LAB — CBC AND DIFFERENTIAL
Hemoglobin: 15.9 (ref 13.5–17.5)
Platelets: 201 (ref 150–399)
WBC: 4.7

## 2019-11-15 LAB — PSA: PSA: 0.5

## 2019-11-15 LAB — TSH: TSH: 2.66 (ref 0.41–5.90)

## 2019-11-22 ENCOUNTER — Telehealth: Payer: Self-pay | Admitting: Internal Medicine

## 2019-12-08 ENCOUNTER — Other Ambulatory Visit: Payer: Self-pay

## 2019-12-08 ENCOUNTER — Ambulatory Visit (INDEPENDENT_AMBULATORY_CARE_PROVIDER_SITE_OTHER): Payer: 59 | Admitting: Internal Medicine

## 2019-12-08 ENCOUNTER — Encounter: Payer: Self-pay | Admitting: Internal Medicine

## 2019-12-08 DIAGNOSIS — E785 Hyperlipidemia, unspecified: Secondary | ICD-10-CM

## 2019-12-08 DIAGNOSIS — G5621 Lesion of ulnar nerve, right upper limb: Secondary | ICD-10-CM

## 2019-12-08 DIAGNOSIS — Z0000 Encounter for general adult medical examination without abnormal findings: Secondary | ICD-10-CM | POA: Diagnosis not present

## 2019-12-08 MED ORDER — ERGOCALCIFEROL 1.25 MG (50000 UT) PO CAPS: 50000.0000 [IU] | ORAL_CAPSULE | ORAL | 4 refills | Status: DC

## 2019-12-08 NOTE — Progress Notes (Signed)
Patient ID: PECOS Jose Boyer, male    DOB: 06-11-1966  Age: 54 y.o. MRN: [id number]  The patient is here for annual  Preventive  examination and management of other chronic and acute problems.   The risk factors are reflected in the social history.  The roster of all physicians providing medical care to patient - is listed in the Snapshot section of the chart.  Activities of daily living:  The patient is 100% independent in all ADLs: dressing, toileting, feeding as well as independent mobility  Home safety : The patient has smoke detectors in the home. They wear seatbelts.  There are no firearms at home. There is no violence in the home.   There is no risks for hepatitis, STDs or HIV. There is no   history of blood transfusion. They have no travel history to infectious disease endemic areas of the world.  The patient has seen their dentist in the last six month. They have seen their eye doctor in the last year. They deny hearing difficulty with regard to whispered voices and some television programs.  They have deferred audiologic testing in the last year.  They do not  have excessive sun exposure. Discussed the need for sun protection: hats, long sleeves and use of sunscreen if there is significant sun exposure.   Diet: the importance of a healthy diet is discussed. They do have a healthy diet.  The benefits of regular aerobic exercise were discussed. He exercises 5  times per week ,  60 minutes.   Depression screen: there are no signs or vegative symptoms of depression- irritability, change in appetite, anhedonia, sadness/tearfullness.  Cognitive assessment: the patient manages all their financial and personal affairs and is actively engaged. They could relate day,date,year and events; recalled 2/3 objects at 3 minutes; performed clock-face test normally.  The following portions of the patient's history were reviewed and updated as appropriate: allergies, current medications, past family  history, past medical history,  past surgical history, past social history  and problem list.  Visual acuity was not assessed per patient preference since she has regular follow up with her ophthalmologist. Hearing and body mass index were assessed and reviewed.   During the course of the visit the patient was educated and counseled about appropriate screening and preventive services including : fall prevention , diabetes screening, nutrition counseling, colorectal cancer screening, and recommended immunizations.    CC: Diagnoses of Hyperlipidemia LDL goal <130, Encounter for preventive health examination, and Ulnar nerve entrapment at elbow, right were pertinent to this visit.  Occasional episodes of numbness in right 4th and 5th fingers brought on by prolonged rest of right elbow on surface.s   History Henoch has a past medical history of Closed T12 fracture ([place]) (2005), Contact lens/glasses fitting, GERD (gastroesophageal reflux disease), History of migraine, Hyperlipemia, Impingement syndrome of right shoulder (08/28/2012), and Right rotator cuff tear (08/28/2012).   He has a past surgical history that includes Appendectomy (4/13); Upper gi endoscopy; Colonoscopy; Shoulder arthroscopy with rotator cuff repair and subacromial decompression (08/28/2012); Colonoscopy with propofol (N/A, 03/23/2018); Esophagogastroduodenoscopy (egd) with propofol (N/A, 03/23/2018); and polypectomy (N/A, 03/23/2018).   His family history includes Coronary artery disease (age of onset: 94) in his father.He reports that he has never smoked. He has never used smokeless tobacco. He reports current alcohol use of about 4.0 standard drinks of alcohol per week. He reports that he does not use drugs.  Outpatient Medications Prior to Visit  Medication Sig Dispense Refill  .  aspirin 325 MG tablet Take 325 mg by mouth daily.    Marland Kitchen LIVALO 2 MG TABS   1  . omeprazole (PRILOSEC) 40 MG capsule Take 40 mg by mouth every evening.     . traZODone (DESYREL) 50 MG tablet Take 0.5-1 tablets (25-50 mg total) by mouth at bedtime as needed for sleep. (Patient not taking: Reported on 12/08/2019) 30 tablet 3   No facility-administered medications prior to visit.    Review of Systems  Patient denies headache, fevers, malaise, unintentional weight loss, skin rash, eye pain, sinus congestion and sinus pain, sore throat, dysphagia,  hemoptysis , cough, dyspnea, wheezing, chest pain, palpitations, orthopnea, edema, abdominal pain, nausea, melena, diarrhea, constipation, flank pain, dysuria, hematuria, urinary  Frequency, nocturia, numbness, tingling, seizures,  Focal weakness, Loss of consciousness,  Tremor, insomnia, depression, anxiety, and suicidal ideation.     Objective:  BP 130/82 (BP Location: Left Arm, Patient Position: Sitting, Cuff Size: Normal)   Pulse 74   Temp 97.9 F (36.6 C) (Temporal)   Resp 14   Ht 6' (1.829 m)   Wt 175 lb 6.4 oz (79.6 kg)   SpO2 98%   BMI 23.79 kg/m   Physical Exam   General appearance: alert, cooperative and appears stated age Ears: normal TM's and external ear canals both ears Throat: lips, mucosa, and tongue normal; teeth and gums normal Neck: no adenopathy, no carotid bruit, supple, symmetrical, trachea midline and thyroid not enlarged, symmetric, no tenderness/mass/nodules Back: symmetric, no curvature. ROM normal. No CVA tenderness. Lungs: clear to auscultation bilaterally Heart: regular rate and rhythm, S1, S2 normal, no murmur, click, rub or gallop Abdomen: soft, non-tender; bowel sounds normal; no masses,  no organomegaly Pulses: 2+ and symmetric Skin: Skin color, texture, turgor normal. No rashes or lesions Lymph nodes: Cervical, supraclavicular, and axillary nodes normal. Ext:  Right hand : no hypothenar wasting Neuro: CNs 2-12 intact. DTRs 2+/4 in biceps, brachioradialis, patellars and achilles. Muscle strength 5/5 in upper and lower exremities. Fine resting tremor bilaterally  both hands cerebellar function normal. Romberg negative.  No pronator drift.   Gait normal.     Assessment & Plan:   Problem List Items Addressed This Visit      Unprioritized   Hyperlipidemia LDL goal <130    LDL is at goal on current statin therapy . Continue current dose   Lab Results  Component Value Date   CHOL 219 (A) 11/15/2019   HDL 80 (A) 11/15/2019   [place] 121 11/15/2019   TRIG 102 11/15/2019          Encounter for preventive health examination    age appropriate education and counseling updated, referrals for preventative services and immunizations addressed, dietary and smoking counseling addressed, most recent labs reviewed.  I have personally reviewed and have noted:  1) the patient's medical and social history 2) The pt's use of alcohol, tobacco, and illicit drugs 3) The patient's current medications and supplements 4) Functional ability including ADL's, fall risk, home safety risk, hearing and visual impairment 5) Diet and physical activities 6) Evidence for depression or mood disorder 7) The patient's height, weight, and BMI have been recorded in the chart  I have made referrals, and provided counseling and education based on review of the above      Ulnar nerve entrapment at elbow, right    Suggested by current symptoms of numbness in 4th and 5th fingers  That is intermittent,  With history of right olecranon fracture remotely.  Exam  and strength is currently normal          I have discontinued Freda Munro T. Whipkey's traZODone. I am also having him start on ergocalciferol. Additionally, I am having him maintain his omeprazole, Livalo, and aspirin.  Meds ordered this encounter  Medications  . ergocalciferol (DRISDOL) 1.25 MG (50000 UT) capsule    Sig: Take 1 capsule (50,000 Units total) by mouth every 30 (thirty) days.    Dispense:  3 capsule    Refill:  4    Medications Discontinued During This Encounter  Medication Reason  . traZODone  (DESYREL) 50 MG tablet Patient has not taken in last 30 days    Follow-up: No follow-ups on file.   Crecencio Mc, MD

## 2019-12-10 DIAGNOSIS — G5621 Lesion of ulnar nerve, right upper limb: Secondary | ICD-10-CM | POA: Insufficient documentation

## 2019-12-10 NOTE — Assessment & Plan Note (Signed)
Suggested by current symptoms of numbness in 4th and 5th fingers  That is intermittent,  With history of right olecranon fracture remotely.  Exam and strength is currently normal

## 2019-12-10 NOTE — Assessment & Plan Note (Signed)
LDL is at goal on current statin therapy . Continue current dose   Lab Results  Component Value Date   CHOL 219 (A) 11/15/2019   HDL 80 (A) 11/15/2019   LDLCALC 121 11/15/2019   TRIG 102 11/15/2019

## 2019-12-10 NOTE — Assessment & Plan Note (Signed)

## 2020-05-04 ENCOUNTER — Encounter: Payer: Self-pay | Admitting: Dermatology

## 2020-05-04 ENCOUNTER — Other Ambulatory Visit: Payer: Self-pay

## 2020-05-04 ENCOUNTER — Ambulatory Visit (INDEPENDENT_AMBULATORY_CARE_PROVIDER_SITE_OTHER): Payer: 59 | Admitting: Dermatology

## 2020-05-04 DIAGNOSIS — L578 Other skin changes due to chronic exposure to nonionizing radiation: Secondary | ICD-10-CM

## 2020-05-04 DIAGNOSIS — L821 Other seborrheic keratosis: Secondary | ICD-10-CM | POA: Diagnosis not present

## 2020-05-04 DIAGNOSIS — L814 Other melanin hyperpigmentation: Secondary | ICD-10-CM

## 2020-05-04 DIAGNOSIS — L719 Rosacea, unspecified: Secondary | ICD-10-CM | POA: Diagnosis not present

## 2020-05-04 DIAGNOSIS — D224 Melanocytic nevi of scalp and neck: Secondary | ICD-10-CM | POA: Diagnosis not present

## 2020-05-04 DIAGNOSIS — Z85828 Personal history of other malignant neoplasm of skin: Secondary | ICD-10-CM

## 2020-05-04 DIAGNOSIS — L820 Inflamed seborrheic keratosis: Secondary | ICD-10-CM

## 2020-05-04 DIAGNOSIS — D225 Melanocytic nevi of trunk: Secondary | ICD-10-CM

## 2020-05-04 DIAGNOSIS — D229 Melanocytic nevi, unspecified: Secondary | ICD-10-CM

## 2020-05-04 DIAGNOSIS — Z86018 Personal history of other benign neoplasm: Secondary | ICD-10-CM

## 2020-05-04 NOTE — Progress Notes (Signed)
   Follow-Up Visit   Subjective  Jose Boyer is a 54 y.o. male who presents for the following: Total body (6 mo total body check, hx of BCC R side burn, hx dyplastic nevus sup buttock, hx ISKs scalp and back). The patient presents for Total-Body Skin Exam (TBSE) for skin cancer screening and mole check.  The following portions of the chart were reviewed this encounter and updated as appropriate: Tobacco  Allergies  Meds  Problems  Med Hx  Surg Hx  Fam Hx     Review of Systems: No other skin or systemic complaints except as noted in HPI or Assessment and Plan.   Objective  Well appearing patient in no apparent distress; mood and affect are within normal limits.  A full examination was performed including scalp, head, eyes, ears, nose, lips, neck, chest, axillae, abdomen, back, buttocks, bilateral upper extremities, bilateral lower extremities, hands, feet, fingers, toes, fingernails, and toenails. All findings within normal limits unless otherwise noted below.  Objective  right buttock: Scar with no evidence of recurrence.   Objective  right side burn: Well healed scar with no evidence of recurrence.   Objective  Scalp, trunk, face, left hand (33):   Objective  Head - Anterior (Face): Mid face erythema with telangiectasias +/- scattered inflammatory papules.   Objective  left neck and back: Brown macule  Assessment & Plan  Hx of dysplastic nevus right buttock Clear. Observe for recurrence. Call clinic for new or changing lesions.  Recommend regular skin exams, daily broad-spectrum spf 30+ sunscreen use, and photoprotection.   History of basal cell carcinoma right side burn Clear. Observe for recurrence. Call clinic for new or changing lesions.  Recommend regular skin exams, daily broad-spectrum spf 30+ sunscreen use, and photoprotection.   Inflamed seborrheic keratosis (33) Scalp, trunk, face, left hand  Destruction of lesion - Scalp, trunk, face, left  hand Complexity: simple   Destruction method: cryotherapy   Informed consent: discussed and consent obtained   Timeout:  patient name, date of birth, surgical site, and procedure verified Lesion destroyed using liquid nitrogen: Yes   Region frozen until ice ball extended beyond lesion: Yes   Outcome: patient tolerated procedure well with no complications   Post-procedure details: wound care instructions given    Rosacea -erythrotelangiectatic type Head - Anterior (Face) Discussed laser treatment. Patient will consider for future.   Nevus left neck and back Benign-appearing.  Observation.  Call clinic for new or changing moles.  Recommend daily use of broad spectrum spf 30+ sunscreen to sun-exposed areas.    Lentigines - Scattered tan macules - Discussed due to sun exposure - Benign, observe - Call for any changes  Actinic Damage - diffuse scaly erythematous macules with underlying dyspigmentation - Recommend daily broad spectrum sunscreen SPF 30+ to sun-exposed areas, reapply every 2 hours as needed.  - Call for new or changing lesions.  Seborrheic Keratoses - Stuck-on, waxy, tan-brown papules and plaques  - Discussed benign etiology and prognosis. - Observe - Call for any changes  Return in about 6 months (around 11/04/2020) for tbse.   IHarriett Boyer, CMA, am acting as scribe for Sarina Ser, MD.  Documentation: I have reviewed the above documentation for accuracy and completeness, and I agree with the above.  Sarina Ser, MD

## 2020-05-11 ENCOUNTER — Encounter: Payer: Self-pay | Admitting: Dermatology

## 2020-06-23 ENCOUNTER — Ambulatory Visit: Payer: 59 | Attending: Internal Medicine

## 2020-06-23 ENCOUNTER — Other Ambulatory Visit: Payer: Self-pay | Admitting: Internal Medicine

## 2020-06-23 DIAGNOSIS — Z23 Encounter for immunization: Secondary | ICD-10-CM

## 2020-06-23 NOTE — Progress Notes (Signed)
° °  Covid-19 Vaccination Clinic  Name:  ELAM ELLIS    MRN: [id number] DOB: 01-10-1966  06/23/2020  Mr. Challis was observed post Covid-19 immunization for 15 minutes without incident. He was provided with Vaccine Information Sheet and instruction to access the V-Safe system.   Mr. Satz was instructed to call 911 with any severe reactions post vaccine:  Difficulty breathing   Swelling of face and throat   A fast heartbeat   A bad rash all over body   Dizziness and weakness

## 2020-08-10 ENCOUNTER — Other Ambulatory Visit: Payer: Self-pay | Admitting: Oral Surgery

## 2020-08-10 DIAGNOSIS — R897 Abnormal histological findings in specimens from other organs, systems and tissues: Secondary | ICD-10-CM | POA: Diagnosis not present

## 2020-08-16 ENCOUNTER — Other Ambulatory Visit: Payer: Self-pay | Admitting: Otolaryngology

## 2020-08-21 ENCOUNTER — Other Ambulatory Visit: Payer: Self-pay | Admitting: Otolaryngology

## 2020-10-31 ENCOUNTER — Other Ambulatory Visit: Payer: Self-pay | Admitting: Otolaryngology

## 2020-11-27 DIAGNOSIS — R5383 Other fatigue: Secondary | ICD-10-CM | POA: Diagnosis not present

## 2020-11-27 LAB — BASIC METABOLIC PANEL
BUN: 13 (ref 4–21)
Creatinine: 1.1 (ref 0.6–1.3)
Glucose: 104
Potassium: 4.7 (ref 3.4–5.3)
Sodium: 141 (ref 137–147)

## 2020-11-27 LAB — HEPATIC FUNCTION PANEL
ALT: 33 (ref 10–40)
AST: 31 (ref 14–40)
Alkaline Phosphatase: 54 (ref 25–125)
Bilirubin, Total: 0.6

## 2020-11-27 LAB — CBC AND DIFFERENTIAL
HCT: 44 (ref 41–53)
Hemoglobin: 15.3 (ref 13.5–17.5)
Platelets: 190 (ref 150–399)
WBC: 4.2

## 2020-11-27 LAB — LIPID PANEL
Cholesterol: 196 (ref 0–200)
HDL: 71 — AB (ref 35–70)
LDL Cholesterol: 104
Triglycerides: 121 (ref 40–160)

## 2020-11-27 LAB — TSH: TSH: 2.56 (ref 0.41–5.90)

## 2020-11-27 LAB — PSA: PSA: 0.4

## 2020-11-30 ENCOUNTER — Encounter: Payer: 59 | Admitting: Dermatology

## 2020-12-03 ENCOUNTER — Telehealth: Payer: Self-pay | Admitting: Internal Medicine

## 2020-12-03 NOTE — Telephone Encounter (Signed)
Your CBC, thyroid PSA,  cholesterol,  liver and kidney function are normal.  You are not anemic or prediabetic    Please Plan to repeat fasting labs in 6 months .   Regards,  Dr. Derrel Nip

## 2020-12-04 NOTE — Telephone Encounter (Signed)
Patients wife Larene Beach was informed of results.  Patient understood and no questions, comments, or concerns at this time.

## 2020-12-13 ENCOUNTER — Other Ambulatory Visit: Payer: Self-pay

## 2020-12-13 MED ORDER — PREDNISONE 10 MG PO TABS
ORAL_TABLET | ORAL | 0 refills | Status: DC
  Filled 2020-12-13: qty 48, 12d supply, fill #0

## 2021-01-04 ENCOUNTER — Ambulatory Visit (INDEPENDENT_AMBULATORY_CARE_PROVIDER_SITE_OTHER): Payer: 59 | Admitting: Dermatology

## 2021-01-04 ENCOUNTER — Other Ambulatory Visit: Payer: Self-pay

## 2021-01-04 DIAGNOSIS — L578 Other skin changes due to chronic exposure to nonionizing radiation: Secondary | ICD-10-CM

## 2021-01-04 DIAGNOSIS — Z1283 Encounter for screening for malignant neoplasm of skin: Secondary | ICD-10-CM | POA: Diagnosis not present

## 2021-01-04 DIAGNOSIS — Z86018 Personal history of other benign neoplasm: Secondary | ICD-10-CM

## 2021-01-04 DIAGNOSIS — Z85828 Personal history of other malignant neoplasm of skin: Secondary | ICD-10-CM | POA: Diagnosis not present

## 2021-01-04 DIAGNOSIS — L820 Inflamed seborrheic keratosis: Secondary | ICD-10-CM

## 2021-01-04 DIAGNOSIS — L719 Rosacea, unspecified: Secondary | ICD-10-CM | POA: Diagnosis not present

## 2021-01-04 DIAGNOSIS — L821 Other seborrheic keratosis: Secondary | ICD-10-CM

## 2021-01-04 DIAGNOSIS — H0260 Xanthelasma of unspecified eye, unspecified eyelid: Secondary | ICD-10-CM

## 2021-01-04 DIAGNOSIS — H0263 Xanthelasma of right eye, unspecified eyelid: Secondary | ICD-10-CM

## 2021-01-04 DIAGNOSIS — D1800 Hemangioma unspecified site: Secondary | ICD-10-CM

## 2021-01-04 DIAGNOSIS — D2372 Other benign neoplasm of skin of left lower limb, including hip: Secondary | ICD-10-CM | POA: Diagnosis not present

## 2021-01-04 DIAGNOSIS — L814 Other melanin hyperpigmentation: Secondary | ICD-10-CM

## 2021-01-04 DIAGNOSIS — D229 Melanocytic nevi, unspecified: Secondary | ICD-10-CM

## 2021-01-04 DIAGNOSIS — D239 Other benign neoplasm of skin, unspecified: Secondary | ICD-10-CM

## 2021-01-04 NOTE — Progress Notes (Signed)
Follow-Up Visit   Subjective  Jose Boyer is a 55 y.o. male who presents for the following: Annual Exam (Mole check ). Hx of BCC right side burn. Hx of Dysplastic nevus right buttock.  He has several irritating growths on the trunk on face and scalp and eyelid.  He would like these treated as they are growing and symptomatic. The patient presents for Total-Body Skin Exam (TBSE) for skin cancer screening and mole check.   The following portions of the chart were reviewed this encounter and updated as appropriate:   Tobacco  Allergies  Meds  Problems  Med Hx  Surg Hx  Fam Hx     Review of Systems:  No other skin or systemic complaints except as noted in HPI or Assessment and Plan.  Objective  Well appearing patient in no apparent distress; mood and affect are within normal limits.  A full examination was performed including scalp, head, eyes, ears, nose, lips, neck, chest, axillae, abdomen, back, buttocks, bilateral upper extremities, bilateral lower extremities, hands, feet, fingers, toes, fingernails, and toenails. All findings within normal limits unless otherwise noted below.  Objective  right buttock: Scar with no evidence of recurrence.   Objective  right side burn area: Well healed scar with no evidence of recurrence.   Objective  face x 4, back x 30 (34), right upper eyelid margin: Erythematous keratotic or waxy stuck-on papule or plaque.   Objective  Face: Erythema and dilated blood vessels  Objective  Right lat popliteal: Firm pink/brown papulenodule with dimple sign.    Assessment & Plan  History of dysplastic nevus right buttock Clear. Observe for recurrence. Call clinic for new or changing lesions.  Recommend regular skin exams, daily broad-spectrum spf 30+ sunscreen use, and photoprotection.  ;  History of basal cell carcinoma (BCC) right side burn area Clear. Observe for recurrence. Call clinic for new or changing lesions.  Recommend regular  skin exams, daily broad-spectrum spf 30+ sunscreen use, and photoprotection.     Inflamed seborrheic keratosis (35) face x 4, back x 30 (34); right upper eyelid Destruction of lesion - face x 4, back x 30 Complexity: simple   Destruction method: cryotherapy   Informed consent: discussed and consent obtained   Timeout:  patient name, date of birth, surgical site, and procedure verified Lesion destroyed using liquid nitrogen: Yes   Region frozen until ice ball extended beyond lesion: Yes   Outcome: patient tolerated procedure well with no complications   Post-procedure details: wound care instructions given    Rosacea - erythrotelangiectatic Face Discussed BBL. Patient will schedule Rosacea is a chronic progressive skin condition usually affecting the face of adults, causing redness and/or acne bumps. It is treatable but not curable. It sometimes affects the eyes (ocular rosacea) as well. It may respond to topical and/or systemic medication and can flare with stress, sun exposure, alcohol, exercise and some foods.  Daily application of broad spectrum spf 30+ sunscreen to face is recommended to reduce flares.  Dermatofibroma Right lat popliteal Benign, observe.   Xanthelasma - minimal evidence Right Medial [place] Benign, observe.   Skin cancer screening   Lentigines - Scattered tan macules - Due to sun exposure - Benign-appering, observe - Recommend daily broad spectrum sunscreen SPF 30+ to sun-exposed areas, reapply every 2 hours as needed. - Call for any changes  Seborrheic Keratoses - Stuck-on, waxy, tan-brown papules and/or plaques  - Benign-appearing - Discussed benign etiology and prognosis. - Observe - Call for any changes  Melanocytic Nevi - Tan-brown and/or pink-flesh-colored symmetric macules and papules - Benign appearing on exam today - Observation - Call clinic for new or changing moles - Recommend daily use of broad spectrum spf 30+ sunscreen to sun-exposed  areas.   Hemangiomas - Red papules - Discussed benign nature - Observe - Call for any changes  Actinic Damage - Chronic condition, secondary to cumulative UV/sun exposure - diffuse scaly erythematous macules with underlying dyspigmentation - Recommend daily broad spectrum sunscreen SPF 30+ to sun-exposed areas, reapply every 2 hours as needed.  - Staying in the shade or wearing long sleeves, sun glasses (UVA+UVB protection) and wide brim hats (4-inch brim around the entire circumference of the hat) are also recommended for sun protection.  - Call for new or changing lesions.  Skin cancer screening performed today.  Return in about 6 months (around 07/06/2021) for Follow up, BBL.  I, Marye Round, CMA, am acting as scribe for Sarina Ser, MD .  Documentation: I have reviewed the above documentation for accuracy and completeness, and I agree with the above.  Sarina Ser, MD

## 2021-01-04 NOTE — Patient Instructions (Signed)

## 2021-01-07 ENCOUNTER — Encounter: Payer: Self-pay | Admitting: Dermatology

## 2021-01-22 ENCOUNTER — Other Ambulatory Visit: Payer: Self-pay

## 2021-01-22 MED ORDER — CARESTART COVID-19 HOME TEST VI KIT
PACK | 0 refills | Status: DC
  Filled 2021-01-22: qty 2, 4d supply, fill #0

## 2021-01-25 ENCOUNTER — Telehealth: Payer: Self-pay | Admitting: Emergency Medicine

## 2021-01-25 MED ORDER — NIRMATRELVIR/RITONAVIR (PAXLOVID)TABLET
3.0000 | ORAL_TABLET | Freq: Two times a day (BID) | ORAL | 0 refills | Status: AC
  Filled 2021-01-25: qty 30, 5d supply, fill #0

## 2021-01-25 NOTE — Telephone Encounter (Signed)
Electronic Rx sent.

## 2021-01-26 ENCOUNTER — Other Ambulatory Visit: Payer: Self-pay

## 2021-01-30 NOTE — Telephone Encounter (Signed)
Electronic Rx sent.

## 2021-02-02 ENCOUNTER — Other Ambulatory Visit: Payer: Self-pay

## 2021-02-15 ENCOUNTER — Other Ambulatory Visit: Payer: Self-pay

## 2021-02-15 MED ORDER — LIVALO 2 MG PO TABS
ORAL_TABLET | ORAL | 3 refills | Status: DC
  Filled 2021-02-15: qty 90, 90d supply, fill #0

## 2021-02-15 MED ORDER — CIPROFLOXACIN-DEXAMETHASONE 0.3-0.1 % OT SUSP
OTIC | 11 refills | Status: DC
  Filled 2021-02-15: qty 7.5, 30d supply, fill #0

## 2021-02-15 MED ORDER — MELOXICAM 15 MG PO TABS
1.0000 | ORAL_TABLET | Freq: Every day | ORAL | 3 refills | Status: DC
  Filled 2021-02-15: qty 90, 90d supply, fill #0

## 2021-02-27 ENCOUNTER — Other Ambulatory Visit: Payer: Self-pay

## 2021-03-07 ENCOUNTER — Other Ambulatory Visit: Payer: Self-pay

## 2021-03-07 MED ORDER — OMEPRAZOLE 40 MG PO CPDR
DELAYED_RELEASE_CAPSULE | ORAL | 2 refills | Status: DC
  Filled 2021-03-07: qty 180, 90d supply, fill #0

## 2021-03-07 MED ORDER — AMOXICILLIN-POT CLAVULANATE 875-125 MG PO TABS
ORAL_TABLET | ORAL | 0 refills | Status: DC
  Filled 2021-03-07: qty 42, 21d supply, fill #0

## 2021-03-22 ENCOUNTER — Other Ambulatory Visit: Payer: Self-pay

## 2021-03-22 MED ORDER — GENTAMICIN SULFATE 0.1 % EX OINT
TOPICAL_OINTMENT | CUTANEOUS | 0 refills | Status: DC
  Filled 2021-03-22: qty 30, 20d supply, fill #0

## 2021-03-22 MED ORDER — CLINDAMYCIN HCL 300 MG PO CAPS
ORAL_CAPSULE | ORAL | 0 refills | Status: DC
  Filled 2021-03-22: qty 90, 30d supply, fill #0

## 2021-05-15 ENCOUNTER — Other Ambulatory Visit: Payer: Self-pay | Admitting: Internal Medicine

## 2021-05-15 MED ORDER — SUCRALFATE 1 GM/10ML PO SUSP
1.0000 g | Freq: Three times a day (TID) | ORAL | 0 refills | Status: DC
  Filled 2021-05-15: qty 420, 10d supply, fill #0

## 2021-05-15 MED ORDER — METOPROLOL SUCCINATE ER 25 MG PO TB24
25.0000 mg | ORAL_TABLET | Freq: Every day | ORAL | 3 refills | Status: DC
  Filled 2021-05-15: qty 90, 90d supply, fill #0

## 2021-05-16 ENCOUNTER — Ambulatory Visit (INDEPENDENT_AMBULATORY_CARE_PROVIDER_SITE_OTHER): Payer: 59

## 2021-05-16 ENCOUNTER — Other Ambulatory Visit (INDEPENDENT_AMBULATORY_CARE_PROVIDER_SITE_OTHER): Payer: 59 | Admitting: Internal Medicine

## 2021-05-16 ENCOUNTER — Other Ambulatory Visit: Payer: Self-pay | Admitting: Internal Medicine

## 2021-05-16 ENCOUNTER — Other Ambulatory Visit: Payer: Self-pay

## 2021-05-16 ENCOUNTER — Other Ambulatory Visit: Payer: Self-pay | Admitting: *Deleted

## 2021-05-16 DIAGNOSIS — I10 Essential (primary) hypertension: Secondary | ICD-10-CM | POA: Diagnosis not present

## 2021-05-16 DIAGNOSIS — R0789 Other chest pain: Secondary | ICD-10-CM

## 2021-05-16 DIAGNOSIS — U071 COVID-19: Secondary | ICD-10-CM | POA: Diagnosis not present

## 2021-05-16 DIAGNOSIS — E785 Hyperlipidemia, unspecified: Secondary | ICD-10-CM

## 2021-05-16 LAB — TROPONIN I: Troponin I: 5 ng/L (ref <=47)

## 2021-05-16 MED ORDER — ALPRAZOLAM 0.25 MG PO TABS
0.2500 mg | ORAL_TABLET | Freq: Two times a day (BID) | ORAL | 0 refills | Status: DC | PRN
  Filled 2021-05-16: qty 20, 10d supply, fill #0

## 2021-05-16 NOTE — Progress Notes (Unsigned)
lraP

## 2021-05-16 NOTE — Progress Notes (Signed)
Patient presented for EKG, patient voiced no concerns nor showed any signs of distress during EKG.

## 2021-05-16 NOTE — Assessment & Plan Note (Signed)
Patient has new onset hypertension and atypical chest pain occurring at rest and with twisting /turning.  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

## 2021-05-16 NOTE — Progress Notes (Signed)
Patient has new onset hypertension and atypical chest pain occurring at rest and with twisting /turning.  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

## 2021-05-17 DIAGNOSIS — I10 Essential (primary) hypertension: Secondary | ICD-10-CM | POA: Diagnosis not present

## 2021-05-17 DIAGNOSIS — R079 Chest pain, unspecified: Secondary | ICD-10-CM | POA: Diagnosis not present

## 2021-05-23 LAB — CK ISOENZYMES
CK-BB: NOT DETECTED % of total
CK-MB: 0 % of total (ref <5)
CK-MM: 100 % of total (ref 95–100)
Total CK: 255 U/L — ABNORMAL HIGH (ref 44–196)

## 2021-05-23 LAB — COMPREHENSIVE METABOLIC PANEL
AG Ratio: 2.1 (calc) (ref 1.0–2.5)
ALT: 27 U/L (ref 9–46)
AST: 35 U/L (ref 10–35)
Albumin: 4.8 g/dL (ref 3.6–5.1)
Alkaline phosphatase (APISO): 48 U/L (ref 35–144)
BUN: 20 mg/dL (ref 7–25)
CO2: 26 mmol/L (ref 20–32)
Calcium: 10 mg/dL (ref 8.6–10.3)
Chloride: 100 mmol/L (ref 98–110)
Creat: 1.04 mg/dL (ref 0.70–1.30)
Globulin: 2.3 g/dL (calc) (ref 1.9–3.7)
Glucose, Bld: 118 mg/dL — ABNORMAL HIGH (ref 65–99)
Potassium: 4.2 mmol/L (ref 3.5–5.3)
Sodium: 139 mmol/L (ref 135–146)
Total Bilirubin: 0.5 mg/dL (ref 0.2–1.2)
Total Protein: 7.1 g/dL (ref 6.1–8.1)

## 2021-05-23 LAB — CK TOTAL AND CKMB (NOT AT ARMC)
CK, MB: 4.6 ng/mL (ref 0–5.0)
Relative Index: 1.8 (ref 0–4.0)
Total CK: 254 U/L — ABNORMAL HIGH (ref 44–196)

## 2021-05-23 LAB — LIPID PANEL W/REFLEX DIRECT LDL
Cholesterol: 266 mg/dL — ABNORMAL HIGH (ref <200)
HDL: 88 mg/dL (ref >=40)
LDL Cholesterol (Calc): 144 mg/dL (calc) — ABNORMAL HIGH
Non-HDL Cholesterol (Calc): 178 mg/dL (calc) — ABNORMAL HIGH (ref <130)
Total CHOL/HDL Ratio: 3 (calc) (ref <5.0)
Triglycerides: 198 mg/dL — ABNORMAL HIGH (ref <150)

## 2021-05-23 LAB — SARS-COV-2 SEMI-QUANTITATIVE TOTAL ANTIBODY, SPIKE: SARS COV2 AB, Total Spike Semi QN: >2500 U/mL — ABNORMAL HIGH (ref <0.8)

## 2021-06-15 ENCOUNTER — Other Ambulatory Visit: Payer: Self-pay

## 2021-06-15 MED ORDER — LIVALO 2 MG PO TABS
ORAL_TABLET | ORAL | 3 refills | Status: DC
  Filled 2021-06-15: qty 90, 90d supply, fill #0
  Filled 2021-08-17 – 2021-08-27 (×2): qty 90, 90d supply, fill #1

## 2021-06-21 ENCOUNTER — Other Ambulatory Visit: Payer: Self-pay

## 2021-06-21 ENCOUNTER — Ambulatory Visit (INDEPENDENT_AMBULATORY_CARE_PROVIDER_SITE_OTHER): Payer: 59 | Admitting: Dermatology

## 2021-06-21 DIAGNOSIS — Z86018 Personal history of other benign neoplasm: Secondary | ICD-10-CM | POA: Diagnosis not present

## 2021-06-21 DIAGNOSIS — L820 Inflamed seborrheic keratosis: Secondary | ICD-10-CM | POA: Diagnosis not present

## 2021-06-21 DIAGNOSIS — I781 Nevus, non-neoplastic: Secondary | ICD-10-CM

## 2021-06-21 DIAGNOSIS — L814 Other melanin hyperpigmentation: Secondary | ICD-10-CM

## 2021-06-21 DIAGNOSIS — Z85828 Personal history of other malignant neoplasm of skin: Secondary | ICD-10-CM | POA: Diagnosis not present

## 2021-06-21 DIAGNOSIS — L578 Other skin changes due to chronic exposure to nonionizing radiation: Secondary | ICD-10-CM

## 2021-06-21 DIAGNOSIS — L821 Other seborrheic keratosis: Secondary | ICD-10-CM | POA: Diagnosis not present

## 2021-06-21 DIAGNOSIS — Z1283 Encounter for screening for malignant neoplasm of skin: Secondary | ICD-10-CM | POA: Diagnosis not present

## 2021-06-21 DIAGNOSIS — D229 Melanocytic nevi, unspecified: Secondary | ICD-10-CM | POA: Diagnosis not present

## 2021-06-21 DIAGNOSIS — D1800 Hemangioma unspecified site: Secondary | ICD-10-CM | POA: Diagnosis not present

## 2021-06-21 DIAGNOSIS — L719 Rosacea, unspecified: Secondary | ICD-10-CM

## 2021-06-21 NOTE — Patient Instructions (Signed)

## 2021-06-21 NOTE — Progress Notes (Signed)
Follow-Up Visit   Subjective  Jose Boyer is a 55 y.o. male who presents for the following: Follow-up (ISK follow up of face, back treated with LN2). The patient presents for Total-Body Skin Exam (TBSE) for skin cancer screening and mole check. . The following portions of the chart were reviewed this encounter and updated as appropriate:   Tobacco  Allergies  Meds  Problems  Med Hx  Surg Hx  Fam Hx     Review of Systems:  No other skin or systemic complaints except as noted in HPI or Assessment and Plan.  Objective  Well appearing patient in no apparent distress; mood and affect are within normal limits.  All skin waist up examined.  Scalp, face, trunk - Total x 35 (35) Erythematous keratotic or waxy stuck-on papule or plaque.   Face Dilated blood vessels            Assessment & Plan   History of Dysplastic Nevi - No evidence of recurrence today - Recommend regular full body skin exams - Recommend daily broad spectrum sunscreen SPF 30+ to sun-exposed areas, reapply every 2 hours as needed.  - Call if any new or changing lesions are noted between office visits  History of Basal Cell Carcinoma of the Skin - No evidence of recurrence today - Recommend regular full body skin exams - Recommend daily broad spectrum sunscreen SPF 30+ to sun-exposed areas, reapply every 2 hours as needed.  - Call if any new or changing lesions are noted between office visits  Lentigines - Scattered tan macules - Due to sun exposure - Benign-appearing, observe - Recommend daily broad spectrum sunscreen SPF 30+ to sun-exposed areas, reapply every 2 hours as needed. - Call for any changes  Seborrheic Keratoses - Stuck-on, waxy, tan-brown papules and/or plaques  - Benign-appearing - Discussed benign etiology and prognosis. - Observe - Call for any changes  Melanocytic Nevi - Tan-brown and/or pink-flesh-colored symmetric macules and papules - Benign appearing on exam  today - Observation - Call clinic for new or changing moles - Recommend daily use of broad spectrum spf 30+ sunscreen to sun-exposed areas.   Hemangiomas - Red papules - Discussed benign nature - Observe - Call for any changes  Actinic Damage - Chronic condition, secondary to cumulative UV/sun exposure - diffuse scaly erythematous macules with underlying dyspigmentation - Recommend daily broad spectrum sunscreen SPF 30+ to sun-exposed areas, reapply every 2 hours as needed.  - Staying in the shade or wearing long sleeves, sun glasses (UVA+UVB protection) and wide brim hats (4-inch brim around the entire circumference of the hat) are also recommended for sun protection.  - Call for new or changing lesions.  Skin cancer screening performed today.  Inflamed seborrheic keratosis Scalp, face, trunk - Total x 35  Destruction of lesion - Scalp, face, trunk - Total x 35 Complexity: simple   Destruction method: cryotherapy   Informed consent: discussed and consent obtained   Timeout:  patient name, date of birth, surgical site, and procedure verified Lesion destroyed using liquid nitrogen: Yes   Region frozen until ice ball extended beyond lesion: Yes   Outcome: patient tolerated procedure well with no complications   Post-procedure details: wound care instructions given    Rosacea Face BBL to bilateral cheeks and nose today.  [place] BBL - 06/21/21 1700    Patient Details   Skin Type: II    Anesthestic Cream Applied: No    Photo Takes: Yes  Treatment Details   Date: 06/21/21    Treatment #: 1    Filter: 1st Pass     1st Pass   Location: F    BBL j/cm2: 25    PW Msec Sec: 27    Cooling Temp: 20    Pulses: 48     Rosacea is a chronic progressive skin condition usually affecting the face of adults, causing redness and/or acne bumps. It is treatable but not curable. It sometimes affects the eyes (ocular rosacea) as well. It may respond to topical and/or systemic  medication and can flare with stress, sun exposure, alcohol, exercise and some foods.  Daily application of broad spectrum spf 30+ sunscreen to face is recommended to reduce flares.  Return in about 6 months (around 12/20/2021).  I, Ashok Cordia, CMA, am acting as scribe for Sarina Ser, MD .  Documentation: I have reviewed the above documentation for accuracy and completeness, and I agree with the above.  Sarina Ser, MD

## 2021-06-25 ENCOUNTER — Encounter: Payer: Self-pay | Admitting: Dermatology

## 2021-08-16 ENCOUNTER — Other Ambulatory Visit: Payer: Self-pay

## 2021-08-16 MED ORDER — METOPROLOL TARTRATE 25 MG PO TABS
25.0000 mg | ORAL_TABLET | Freq: Every day | ORAL | 3 refills | Status: DC
  Filled 2021-08-16: qty 90, 90d supply, fill #0

## 2021-08-16 MED ORDER — METOPROLOL SUCCINATE ER 25 MG PO TB24
25.0000 mg | ORAL_TABLET | Freq: Every day | ORAL | 3 refills | Status: DC
  Filled 2021-08-16: qty 90, 90d supply, fill #0

## 2021-08-16 MED ORDER — LIVALO 2 MG PO TABS
ORAL_TABLET | ORAL | 3 refills | Status: DC
  Filled 2021-08-16 – 2022-06-03 (×2): qty 90, 90d supply, fill #0

## 2021-08-16 MED ORDER — MELOXICAM 15 MG PO TABS
15.0000 mg | ORAL_TABLET | Freq: Every day | ORAL | 3 refills | Status: DC
  Filled 2021-08-16: qty 90, 90d supply, fill #0

## 2021-08-17 ENCOUNTER — Other Ambulatory Visit: Payer: Self-pay

## 2021-08-27 ENCOUNTER — Other Ambulatory Visit: Payer: Self-pay

## 2021-08-27 MED ORDER — AMOXICILLIN-POT CLAVULANATE 875-125 MG PO TABS
ORAL_TABLET | ORAL | 1 refills | Status: DC
  Filled 2021-08-27: qty 28, 14d supply, fill #0

## 2021-08-27 MED ORDER — OMEPRAZOLE 40 MG PO CPDR
DELAYED_RELEASE_CAPSULE | ORAL | 3 refills | Status: DC
Start: 2021-08-27 — End: 2023-05-05
  Filled 2021-08-27: qty 180, 90d supply, fill #0

## 2021-09-09 IMAGING — DX DG CHEST 2V
2 series · 2 of 2 positions shown · non-contrast
Comparison: None.

CLINICAL DATA: 55-year-old male with new onset hypertension and
chest pain

EXAM:
CHEST - 2 VIEW

[chest pa]
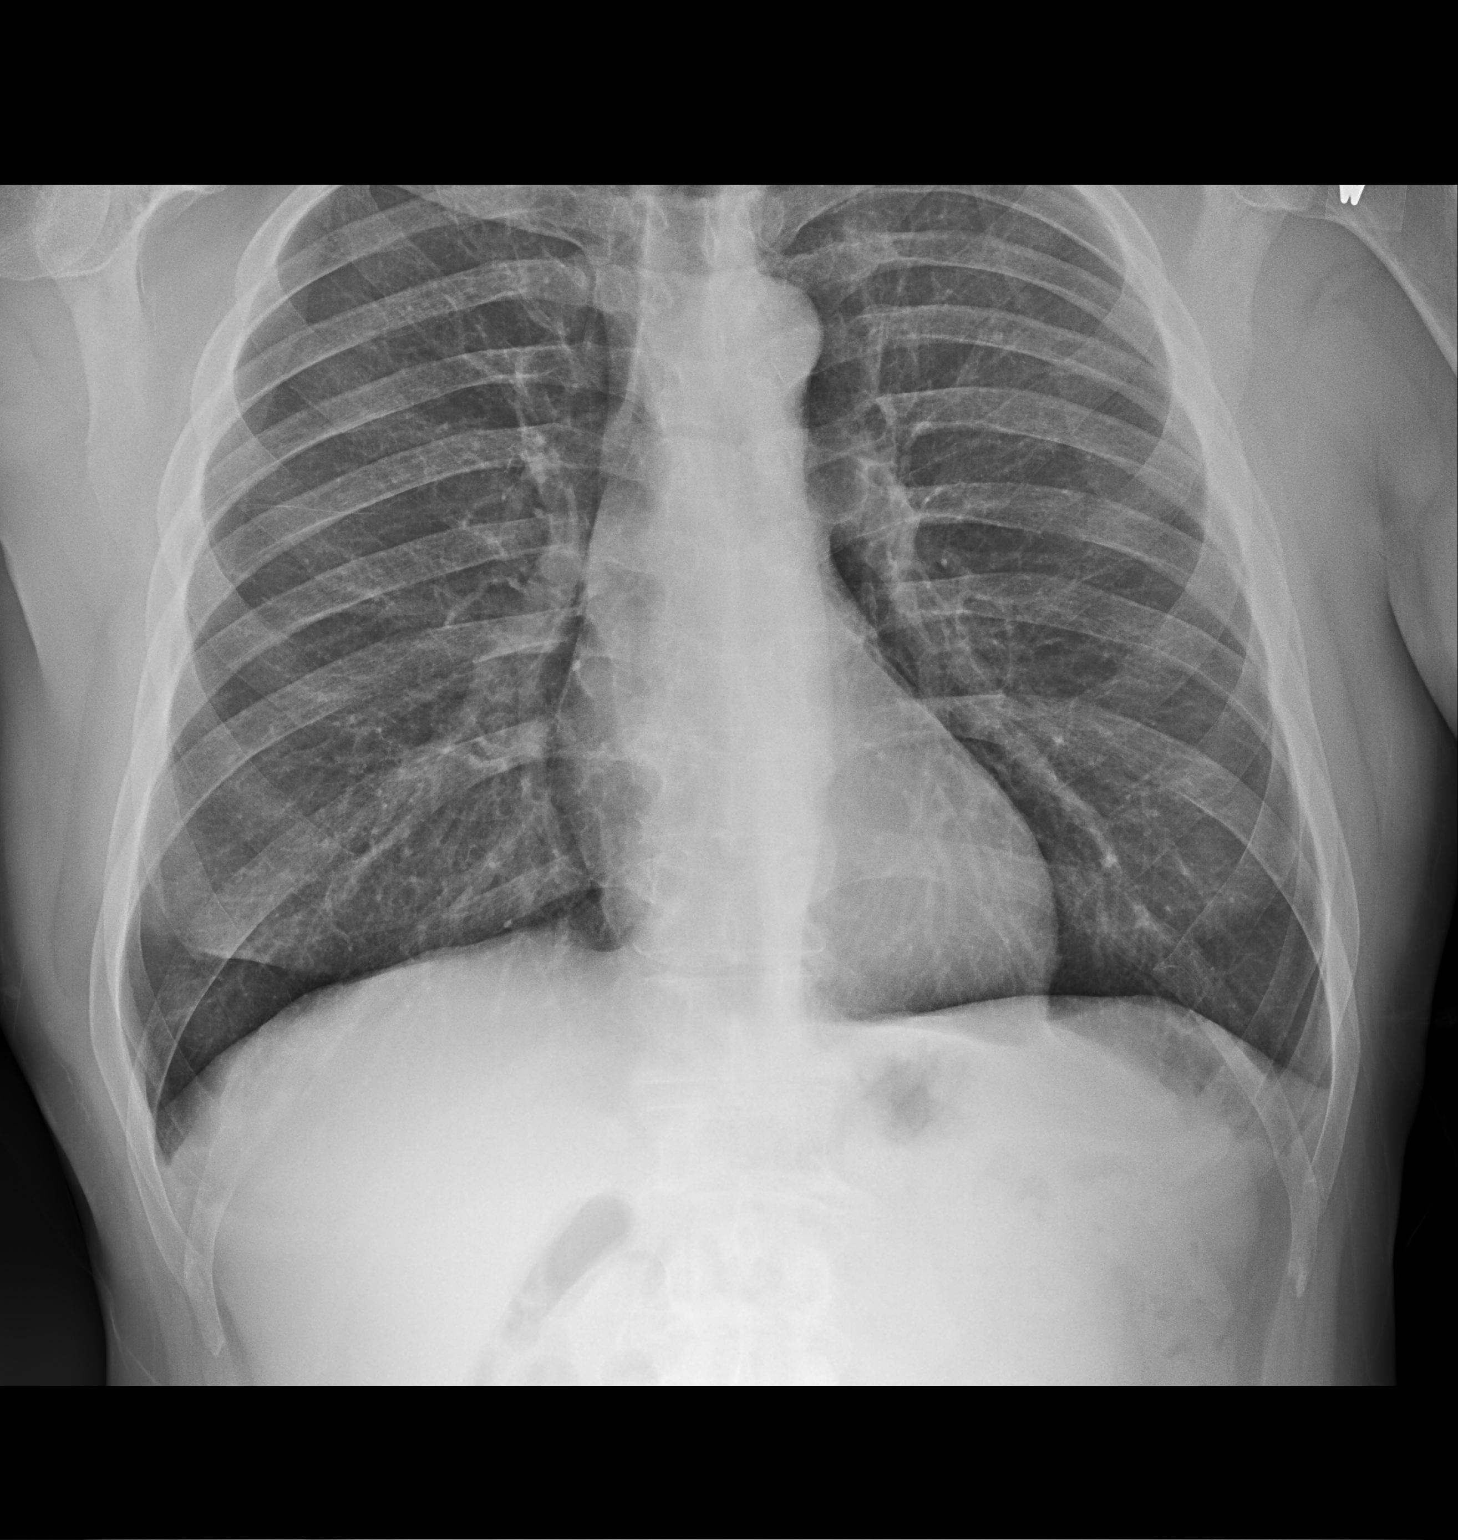

[chest lat]
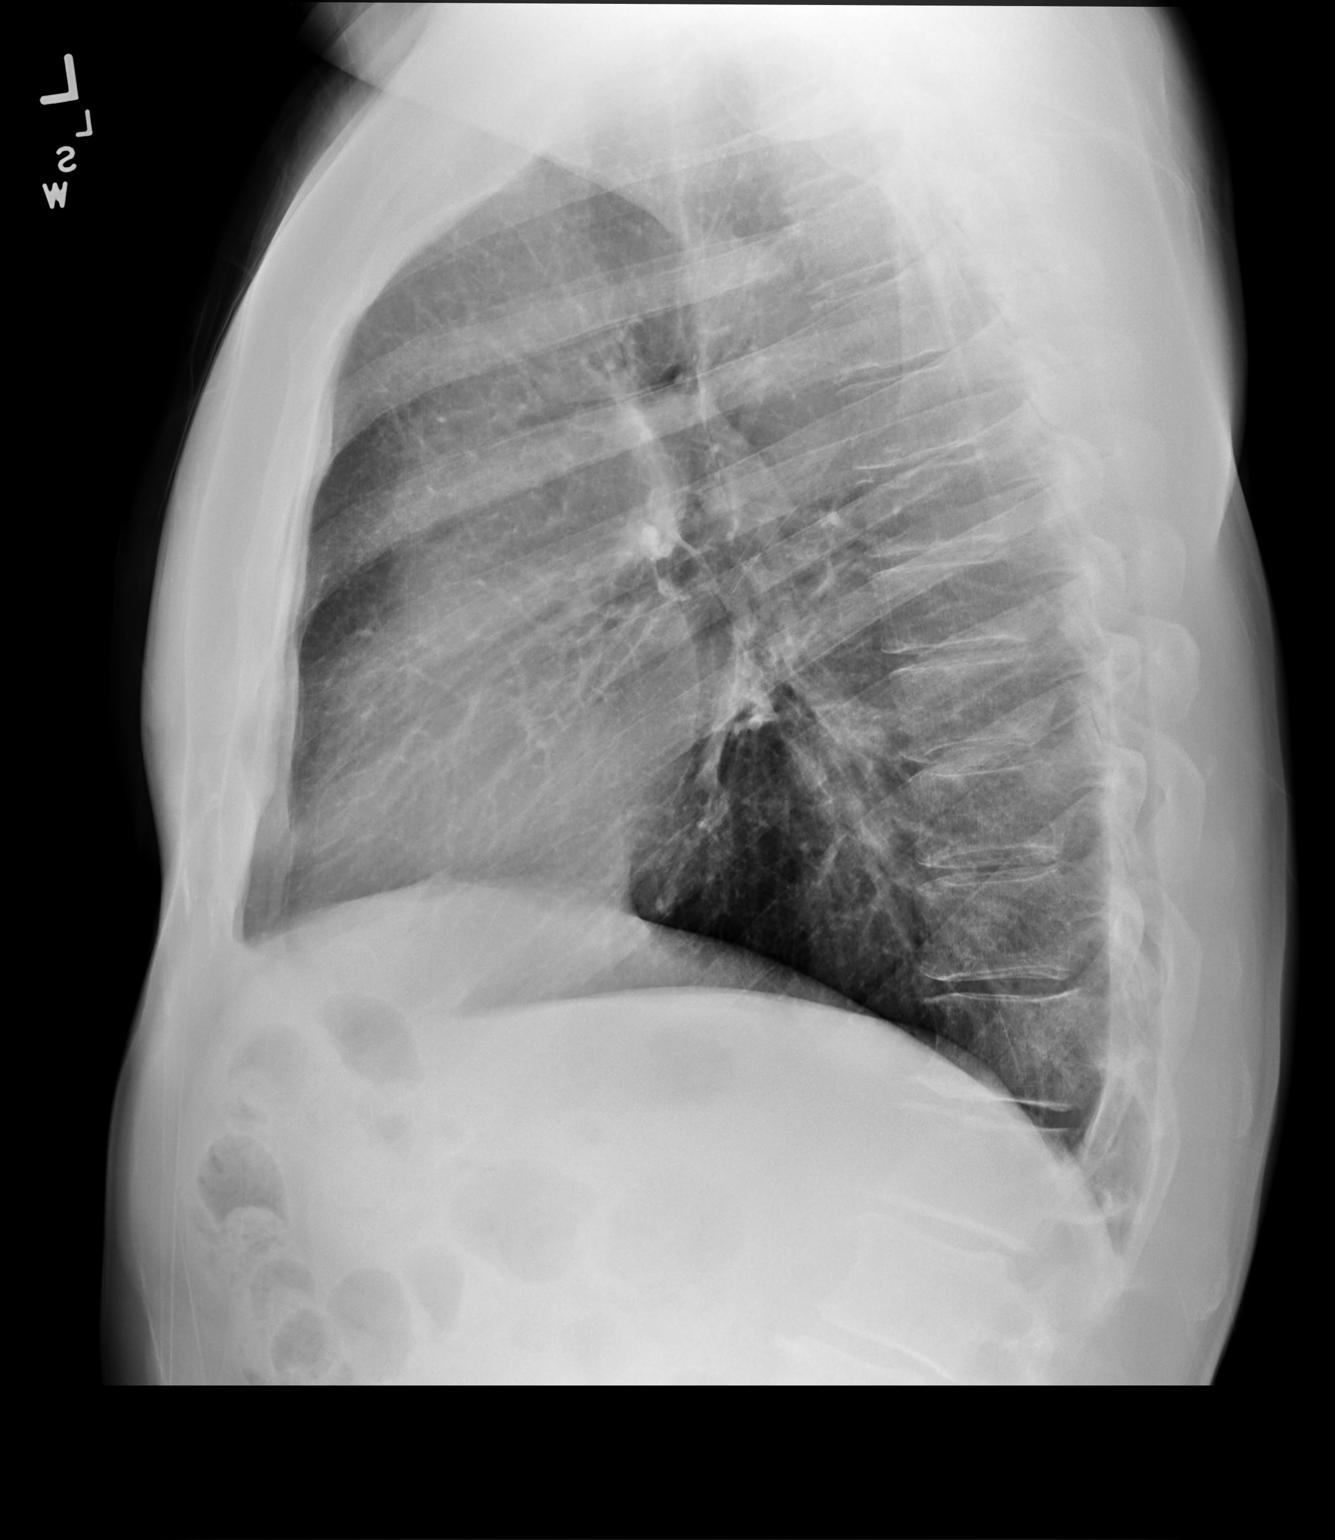

[2 of 2 positions shown; findings below may reference images not displayed]

FINDINGS: The heart size and mediastinal contours are within normal limits.
Both lungs are clear. No acute fracture. Mild degenerative changes
of the spine.
IMPRESSION: No active cardiopulmonary disease.

## 2021-09-13 ENCOUNTER — Other Ambulatory Visit: Payer: Self-pay

## 2021-09-13 MED ORDER — PREDNISONE 10 MG PO TABS
ORAL_TABLET | ORAL | 0 refills | Status: DC
  Filled 2021-09-13: qty 48, 12d supply, fill #0

## 2021-10-03 ENCOUNTER — Other Ambulatory Visit: Payer: Self-pay

## 2021-11-20 ENCOUNTER — Other Ambulatory Visit: Payer: Self-pay

## 2021-11-20 MED ORDER — OMEPRAZOLE 40 MG PO CPDR
DELAYED_RELEASE_CAPSULE | ORAL | 3 refills | Status: DC
  Filled 2021-11-20: qty 90, 45d supply, fill #0

## 2021-11-20 MED ORDER — METOPROLOL SUCCINATE ER 25 MG PO TB24
25.0000 mg | ORAL_TABLET | Freq: Every day | ORAL | 3 refills | Status: DC
  Filled 2021-11-20: qty 90, 90d supply, fill #0

## 2021-11-20 MED ORDER — MELOXICAM 15 MG PO TABS
15.0000 mg | ORAL_TABLET | Freq: Every day | ORAL | 3 refills | Status: DC
  Filled 2021-11-20: qty 90, 90d supply, fill #0

## 2021-11-20 MED ORDER — LIVALO 2 MG PO TABS
ORAL_TABLET | ORAL | 3 refills | Status: DC
  Filled 2021-11-20: qty 90, 90d supply, fill #0

## 2021-11-21 ENCOUNTER — Other Ambulatory Visit: Payer: Self-pay

## 2021-12-06 DIAGNOSIS — M1812 Unilateral primary osteoarthritis of first carpometacarpal joint, left hand: Secondary | ICD-10-CM | POA: Diagnosis not present

## 2021-12-20 ENCOUNTER — Ambulatory Visit: Payer: 59 | Admitting: Dermatology

## 2022-01-03 ENCOUNTER — Ambulatory Visit: Payer: 59 | Admitting: Dermatology

## 2022-01-24 ENCOUNTER — Ambulatory Visit (INDEPENDENT_AMBULATORY_CARE_PROVIDER_SITE_OTHER): Payer: 59 | Admitting: Dermatology

## 2022-01-24 DIAGNOSIS — L578 Other skin changes due to chronic exposure to nonionizing radiation: Secondary | ICD-10-CM | POA: Diagnosis not present

## 2022-01-24 DIAGNOSIS — L820 Inflamed seborrheic keratosis: Secondary | ICD-10-CM | POA: Diagnosis not present

## 2022-01-24 DIAGNOSIS — L738 Other specified follicular disorders: Secondary | ICD-10-CM | POA: Diagnosis not present

## 2022-01-24 DIAGNOSIS — L821 Other seborrheic keratosis: Secondary | ICD-10-CM

## 2022-01-24 NOTE — Patient Instructions (Addendum)

## 2022-01-24 NOTE — Progress Notes (Signed)
   Follow-Up Visit   Subjective  Jose Boyer is a 56 y.o. male who presents for the following: Follow-up (Patient here today for ISK follow up. ). The patient has spots, moles and lesions to be evaluated, some may be new or changing and the patient has concerns.   The following portions of the chart were reviewed this encounter and updated as appropriate:   Tobacco  Allergies  Meds  Problems  Med Hx  Surg Hx  Fam Hx     Review of Systems:  No other skin or systemic complaints except as noted in HPI or Assessment and Plan.  Objective  Well appearing patient in no apparent distress; mood and affect are within normal limits.  A focused examination was performed including face, neck, chest and back and arms. Relevant physical exam findings are noted in the Assessment and Plan.  trunk x 22, scalp x 6 (28) Erythematous stuck-on, waxy papule or plaque  forehead Small yellow papules with a central dell.    Assessment & Plan  Inflamed seborrheic keratosis (28) trunk x 22, scalp x 6 Symptomatic, irritating, patient would like treated.  Destruction of lesion - trunk x 22, scalp x 6 Complexity: simple   Destruction method: cryotherapy   Informed consent: discussed and consent obtained   Timeout:  patient name, date of birth, surgical site, and procedure verified Lesion destroyed using liquid nitrogen: Yes   Region frozen until ice ball extended beyond lesion: Yes   Outcome: patient tolerated procedure well with no complications   Post-procedure details: wound care instructions given    Sebaceous hyperplasia forehead Benign-appearing.  Observation.  Call clinic for new or changing lesions.  Recommend daily use of broad spectrum spf 30+ sunscreen to sun-exposed areas.   Seborrheic Keratoses - Stuck-on, waxy, tan-brown papules and/or plaques  - Benign-appearing - Discussed benign etiology and prognosis. - Observe - Call for any changes  Actinic Damage - chronic,  secondary to cumulative UV radiation exposure/sun exposure over time - diffuse scaly erythematous macules with underlying dyspigmentation - Recommend daily broad spectrum sunscreen SPF 30+ to sun-exposed areas, reapply every 2 hours as needed.  - Recommend staying in the shade or wearing long sleeves, sun glasses (UVA+UVB protection) and wide brim hats (4-inch brim around the entire circumference of the hat). - Call for new or changing lesions.  Return in about 6 months (around 07/27/2022) for TBSE.  Graciella Belton, RMA, am acting as scribe for Sarina Ser, MD . Documentation: I have reviewed the above documentation for accuracy and completeness, and I agree with the above.  Sarina Ser, MD

## 2022-01-31 ENCOUNTER — Other Ambulatory Visit: Payer: Self-pay

## 2022-01-31 MED ORDER — PREDNISONE 10 MG PO TABS
ORAL_TABLET | ORAL | 0 refills | Status: DC
  Filled 2022-01-31: qty 100, 90d supply, fill #0

## 2022-01-31 MED ORDER — METHYLPREDNISOLONE 4 MG PO TBPK
ORAL_TABLET | ORAL | 0 refills | Status: DC
  Filled 2022-01-31: qty 21, 6d supply, fill #0

## 2022-02-03 ENCOUNTER — Encounter: Payer: Self-pay | Admitting: Dermatology

## 2022-02-19 ENCOUNTER — Other Ambulatory Visit: Payer: Self-pay

## 2022-02-19 MED ORDER — METOPROLOL SUCCINATE ER 25 MG PO TB24
25.0000 mg | ORAL_TABLET | Freq: Every day | ORAL | 3 refills | Status: DC
  Filled 2022-02-19: qty 90, 90d supply, fill #0

## 2022-02-22 ENCOUNTER — Other Ambulatory Visit: Payer: Self-pay

## 2022-02-22 MED ORDER — ASPIRIN 81 MG PO TBEC: DELAYED_RELEASE_TABLET | ORAL | 3 refills | Status: DC

## 2022-02-25 DIAGNOSIS — R5383 Other fatigue: Secondary | ICD-10-CM | POA: Diagnosis not present

## 2022-02-25 LAB — BASIC METABOLIC PANEL
BUN: 18 (ref 4–21)
Chloride: 100 (ref 99–108)
Creatinine: 1 (ref 0.6–1.3)
Glucose: 107
Potassium: 4.6 mEq/L (ref 3.5–5.1)
Sodium: 138 (ref 137–147)

## 2022-02-25 LAB — IRON,TIBC AND FERRITIN PANEL: Iron: 53

## 2022-02-25 LAB — CBC AND DIFFERENTIAL
HCT: 41 (ref 41–53)
Hemoglobin: 14 (ref 13.5–17.5)
Neutrophils Absolute: 2.3
Platelets: 187 10*3/uL (ref 150–400)
WBC: 4.6

## 2022-02-25 LAB — LIPID PANEL
Cholesterol: 188 (ref 0–200)
HDL: 58 (ref 35–70)
LDL Cholesterol: 105
Triglycerides: 140 (ref 40–160)

## 2022-02-25 LAB — COMPREHENSIVE METABOLIC PANEL WITH GFR
Albumin: 4.5 (ref 3.5–5.0)
Calcium: 9.3 (ref 8.7–10.7)
Globulin: 1.7
eGFR: 84

## 2022-02-25 LAB — HEPATIC FUNCTION PANEL
ALT: 31 U/L (ref 10–40)
AST: 28 (ref 14–40)
Alkaline Phosphatase: 54 (ref 25–125)
Bilirubin, Total: 0.2

## 2022-02-25 LAB — PSA: PSA: 0.5

## 2022-02-25 LAB — CBC: RBC: 4.06 (ref 3.87–5.11)

## 2022-02-25 LAB — TSH: TSH: 2.89 (ref 0.41–5.90)

## 2022-03-25 ENCOUNTER — Other Ambulatory Visit: Payer: Self-pay

## 2022-04-17 ENCOUNTER — Other Ambulatory Visit: Payer: Self-pay

## 2022-04-17 MED ORDER — OMEPRAZOLE 40 MG PO CPDR
DELAYED_RELEASE_CAPSULE | ORAL | 3 refills | Status: DC
  Filled 2022-04-17: qty 90, 45d supply, fill #0

## 2022-04-17 MED ORDER — METOPROLOL SUCCINATE ER 25 MG PO TB24
25.0000 mg | ORAL_TABLET | Freq: Every day | ORAL | 3 refills | Status: DC
  Filled 2022-04-17 – 2022-06-03 (×2): qty 90, 90d supply, fill #0
  Filled 2022-06-03: qty 50, 50d supply, fill #0

## 2022-05-03 ENCOUNTER — Other Ambulatory Visit: Payer: Self-pay

## 2022-05-03 MED ORDER — MELOXICAM 15 MG PO TABS
15.0000 mg | ORAL_TABLET | Freq: Every day | ORAL | 3 refills | Status: DC
  Filled 2022-05-03: qty 90, 90d supply, fill #0

## 2022-05-03 MED ORDER — CYCLOBENZAPRINE HCL 10 MG PO TABS
ORAL_TABLET | ORAL | 1 refills | Status: DC
  Filled 2022-05-03: qty 90, 30d supply, fill #0

## 2022-06-03 ENCOUNTER — Other Ambulatory Visit: Payer: Self-pay

## 2022-06-17 ENCOUNTER — Other Ambulatory Visit: Payer: Self-pay

## 2022-06-17 MED ORDER — OMEPRAZOLE 40 MG PO CPDR
DELAYED_RELEASE_CAPSULE | ORAL | 3 refills | Status: AC
  Filled 2022-06-17: qty 180, 90d supply, fill #0
  Filled 2022-10-10: qty 180, 90d supply, fill #1

## 2022-07-15 ENCOUNTER — Other Ambulatory Visit: Payer: Self-pay

## 2022-07-15 MED ORDER — METOPROLOL SUCCINATE ER 25 MG PO TB24
25.0000 mg | ORAL_TABLET | Freq: Every day | ORAL | 3 refills | Status: DC
Start: 2022-07-15 — End: 2023-05-02
  Filled 2022-07-15: qty 90, 90d supply, fill #0

## 2022-07-24 DIAGNOSIS — Z23 Encounter for immunization: Secondary | ICD-10-CM | POA: Diagnosis not present

## 2022-08-05 DIAGNOSIS — M189 Osteoarthritis of first carpometacarpal joint, unspecified: Secondary | ICD-10-CM | POA: Diagnosis not present

## 2022-08-05 DIAGNOSIS — M1812 Unilateral primary osteoarthritis of first carpometacarpal joint, left hand: Secondary | ICD-10-CM | POA: Diagnosis not present

## 2022-08-08 ENCOUNTER — Ambulatory Visit: Payer: 59 | Admitting: Dermatology

## 2022-08-13 ENCOUNTER — Ambulatory Visit: Payer: 59 | Admitting: Dermatology

## 2022-08-21 NOTE — Telephone Encounter (Signed)
Orders placed.

## 2022-08-22 ENCOUNTER — Ambulatory Visit (INDEPENDENT_AMBULATORY_CARE_PROVIDER_SITE_OTHER): Payer: 59 | Admitting: Dermatology

## 2022-08-22 DIAGNOSIS — L820 Inflamed seborrheic keratosis: Secondary | ICD-10-CM

## 2022-08-22 DIAGNOSIS — L821 Other seborrheic keratosis: Secondary | ICD-10-CM

## 2022-08-22 DIAGNOSIS — L814 Other melanin hyperpigmentation: Secondary | ICD-10-CM

## 2022-08-22 DIAGNOSIS — D229 Melanocytic nevi, unspecified: Secondary | ICD-10-CM

## 2022-08-22 DIAGNOSIS — L570 Actinic keratosis: Secondary | ICD-10-CM

## 2022-08-22 DIAGNOSIS — Z1283 Encounter for screening for malignant neoplasm of skin: Secondary | ICD-10-CM

## 2022-08-22 DIAGNOSIS — L578 Other skin changes due to chronic exposure to nonionizing radiation: Secondary | ICD-10-CM

## 2022-08-22 DIAGNOSIS — L719 Rosacea, unspecified: Secondary | ICD-10-CM | POA: Diagnosis not present

## 2022-08-22 DIAGNOSIS — Z86018 Personal history of other benign neoplasm: Secondary | ICD-10-CM

## 2022-08-22 NOTE — Patient Instructions (Signed)
Cryotherapy Aftercare  Wash gently with soap and water everyday.   Apply Vaseline and Band-Aid daily until healed.     Due to recent changes in healthcare laws, you may see results of your pathology and/or laboratory studies on MyChart before the doctors have had a chance to review them. We understand that in some cases there may be results that are confusing or concerning to you. Please understand that not all results are received at the same time and often the doctors may need to interpret multiple results in order to provide you with the best plan of care or course of treatment. Therefore, we ask that you please give us 2 business days to thoroughly review all your results before contacting the office for clarification. Should we see a critical lab result, you will be contacted sooner.   If You Need Anything After Your Visit  If you have any questions or concerns for your doctor, please call our main line at [phone] and press option 4 to reach your doctor's medical assistant. If no one answers, please leave a voicemail as directed and we will return your call as soon as possible. Messages left after 4 pm will be answered the following business day.   You may also send us a message via MyChart. We typically respond to MyChart messages within 1-2 business days.  For prescription refills, please ask your pharmacy to contact our office. Our fax number is [phone].  If you have an urgent issue when the clinic is closed that cannot wait until the next business day, you can page your doctor at the number below.    Please note that while we do our best to be available for urgent issues outside of office hours, we are not available 24/7.   If you have an urgent issue and are unable to reach us, you may choose to seek medical care at your doctor's office, retail clinic, urgent care center, or emergency room.  If you have a medical emergency, please immediately call 911 or go to the  emergency department.  Pager Numbers  - Dr. Kowalski: [phone]  - Dr. Moye: [phone]  - Dr. Stewart: [phone]  In the event of inclement weather, please call our main line at [phone] for an update on the status of any delays or closures.  Dermatology Medication Tips: Please keep the boxes that topical medications come in in order to help keep track of the instructions about where and how to use these. Pharmacies typically print the medication instructions only on the boxes and not directly on the medication tubes.   If your medication is too expensive, please contact our office at [phone] option 4 or send us a message through MyChart.   We are unable to tell what your co-pay for medications will be in advance as this is different depending on your insurance coverage. However, we may be able to find a substitute medication at lower cost or fill out paperwork to get insurance to cover a needed medication.   If a prior authorization is required to get your medication covered by your insurance company, please allow us 1-2 business days to complete this process.  Drug prices often vary depending on where the prescription is filled and some pharmacies may offer cheaper prices.  The website www.goodrx.com contains coupons for medications through different pharmacies. The prices here do not account for what the cost may be with help from insurance (it may be cheaper with your insurance), but the website can   give you the price if you did not use any insurance.  - You can print the associated coupon and take it with your prescription to the pharmacy.  - You may also stop by our office during regular business hours and pick up a GoodRx coupon card.  - If you need your prescription sent electronically to a different pharmacy, notify our office through [facility] MyChart or by phone at [phone] option 4.     Si Usted Necesita Algo Despus de Su Visita  Tambin puede  enviarnos un mensaje a travs de MyChart. Por lo general respondemos a los mensajes de MyChart en el transcurso de 1 a 2 das hbiles.  Para renovar recetas, por favor pida a su farmacia que se ponga en contacto con nuestra oficina. Nuestro nmero de fax es el [phone].  Si tiene un asunto urgente cuando la clnica est cerrada y que no puede esperar hasta el siguiente da hbil, puede llamar/localizar a su doctor(a) al nmero que aparece a continuacin.   Por favor, tenga en cuenta que aunque hacemos todo lo posible para estar disponibles para asuntos urgentes fuera del horario de oficina, no estamos disponibles las 24 horas del da, los 7 das de la semana.   Si tiene un problema urgente y no puede comunicarse con nosotros, puede optar por buscar atencin mdica  en el consultorio de su doctor(a), en una clnica privada, en un centro de atencin urgente o en una sala de emergencias.  Si tiene una emergencia mdica, por favor llame inmediatamente al 911 o vaya a la sala de emergencias.  Nmeros de bper  - Dr. Kowalski: [phone]  - Dra. Moye: [phone]  - Dra. Stewart: [phone]  En caso de inclemencias del tiempo, por favor llame a nuestra lnea principal al [phone] para una actualizacin sobre el estado de cualquier retraso o cierre.  Consejos para la medicacin en dermatologa: Por favor, guarde las cajas en las que vienen los medicamentos de uso tpico para ayudarle a seguir las instrucciones sobre dnde y cmo usarlos. Las farmacias generalmente imprimen las instrucciones del medicamento slo en las cajas y no directamente en los tubos del medicamento.   Si su medicamento es muy caro, por favor, pngase en contacto con nuestra oficina llamando al [phone] y presione la opcin 4 o envenos un mensaje a travs de MyChart.   No podemos decirle cul ser su copago por los medicamentos por adelantado ya que esto es diferente dependiendo de la cobertura de su seguro.  Sin embargo, es posible que podamos encontrar un medicamento sustituto a menor costo o llenar un formulario para que el seguro cubra el medicamento que se considera necesario.   Si se requiere una autorizacin previa para que su compaa de seguros cubra su medicamento, por favor permtanos de 1 a 2 das hbiles para completar este proceso.  Los precios de los medicamentos varan con frecuencia dependiendo del lugar de dnde se surte la receta y alguna farmacias pueden ofrecer precios ms baratos.  El sitio web www.goodrx.com tiene cupones para medicamentos de diferentes farmacias. Los precios aqu no tienen en cuenta lo que podra costar con la ayuda del seguro (puede ser ms barato con su seguro), pero el sitio web puede darle el precio si no utiliz ningn seguro.  - Puede imprimir el cupn correspondiente y llevarlo con su receta a la farmacia.  - Tambin puede pasar por nuestra oficina durante el horario de atencin regular y recoger una tarjeta de cupones de GoodRx.  -   Si necesita que su receta se enve electrnicamente a una farmacia diferente, informe a nuestra oficina a travs de MyChart de [facility] o por telfono llamando al [phone] y presione la opcin 4.  

## 2022-08-22 NOTE — Progress Notes (Signed)
Follow-Up Visit   Subjective  Jose Boyer is a 56 y.o. male who presents for the following: Follow-up (History of BCC and dysplastic nevus - The patient presents for Upper Body Skin Exam (UBSE) for skin cancer screening and mole check.  The patient has spots, moles and lesions to be evaluated, some may be new or changing and the patient has concerns that these could be cancer./).  The following portions of the chart were reviewed this encounter and updated as appropriate:   Tobacco  Allergies  Meds  Problems  Med Hx  Surg Hx  Fam Hx     Review of Systems:  No other skin or systemic complaints except as noted in HPI or Assessment and Plan.  Objective  Well appearing patient in no apparent distress; mood and affect are within normal limits.  All skin waist up examined.  Scalp x 2, trunk x 20 (22) Erythematous stuck-on, waxy papule or plaque  Left Ear Erythematous thin papules/macules with gritty scale.   Head - Anterior (Face) Mild pinkness   Assessment & Plan   Lentigines - Scattered tan macules - Due to sun exposure - Benign-appearing, observe - Recommend daily broad spectrum sunscreen SPF 30+ to sun-exposed areas, reapply every 2 hours as needed. - Call for any changes  Seborrheic Keratoses - Stuck-on, waxy, tan-brown papules and/or plaques  - Benign-appearing - Discussed benign etiology and prognosis. - Observe - Call for any changes  Melanocytic Nevi - Tan-brown and/or pink-flesh-colored symmetric macules and papules - Benign appearing on exam today - Observation - Call clinic for new or changing moles - Recommend daily use of broad spectrum spf 30+ sunscreen to sun-exposed areas.   Hemangiomas - Red papules - Discussed benign nature - Observe - Call for any changes  Actinic Damage - Chronic condition, secondary to cumulative UV/sun exposure - diffuse scaly erythematous macules with underlying dyspigmentation - Recommend daily broad  spectrum sunscreen SPF 30+ to sun-exposed areas, reapply every 2 hours as needed.  - Staying in the shade or wearing long sleeves, sun glasses (UVA+UVB protection) and wide brim hats (4-inch brim around the entire circumference of the hat) are also recommended for sun protection.  - Call for new or changing lesions.  History of Dysplastic Nevi - No evidence of recurrence today - Recommend regular full body skin exams - Recommend daily broad spectrum sunscreen SPF 30+ to sun-exposed areas, reapply every 2 hours as needed.  - Call if any new or changing lesions are noted between office visits  Skin cancer screening performed today.  Inflamed seborrheic keratosis (22) Scalp x 2, trunk x 20 Symptomatic, irritating, patient would like treated.  Destruction of lesion - Scalp x 2, trunk x 20 Complexity: simple   Destruction method: cryotherapy   Informed consent: discussed and consent obtained   Timeout:  patient name, date of birth, surgical site, and procedure verified Lesion destroyed using liquid nitrogen: Yes   Region frozen until ice ball extended beyond lesion: Yes   Outcome: patient tolerated procedure well with no complications   Post-procedure details: wound care instructions given    AK (actinic keratosis) Left Ear Destruction of lesion - Left Ear Complexity: simple   Destruction method: cryotherapy   Informed consent: discussed and consent obtained   Timeout:  patient name, date of birth, surgical site, and procedure verified Lesion destroyed using liquid nitrogen: Yes   Region frozen until ice ball extended beyond lesion: Yes   Outcome: patient tolerated procedure well with no  complications   Post-procedure details: wound care instructions given    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 It is treatable but not curable. It  sometimes affects the eyes (ocular rosacea) as well. It may respond to topical and/or systemic medication and can flare with stress, sun exposure, alcohol, exercise, topical steroids (including hydrocortisone/cortisone 10) and some foods.  Daily application of broad spectrum spf 30+ sunscreen to face is recommended to reduce flares.  Improved after laser treatment this past year Currently mild - no treatment Consider laser in the future if necessary for new vessel formation.  Discussed the treatment option of BBL/laser.  Typically we recommend 1-3 treatment sessions about 5-8 weeks apart for best results.  The patient's condition may require "maintenance treatments" in the future.  The fee for BBL / laser treatments is $350 per treatment session for the whole face.  A fee can be quoted for other parts of the body. Insurance typically does not pay for BBL/laser treatments and therefore the fee is an out-of-pocket cost.  Return in about 6 months (around 02/21/2023).  I, Ashok Cordia, CMA, am acting as scribe for Sarina Ser, MD . Documentation: I have reviewed the above documentation for accuracy and completeness, and I agree with the above.  Sarina Ser, MD

## 2022-08-28 ENCOUNTER — Other Ambulatory Visit: Payer: Self-pay

## 2022-08-28 MED ORDER — MELOXICAM 15 MG PO TABS
15.0000 mg | ORAL_TABLET | Freq: Every day | ORAL | 3 refills | Status: DC
  Filled 2022-08-28: qty 90, 90d supply, fill #0

## 2022-09-05 ENCOUNTER — Encounter: Payer: Self-pay | Admitting: Dermatology

## 2022-09-17 ENCOUNTER — Other Ambulatory Visit: Payer: Self-pay

## 2022-09-17 MED ORDER — PITAVASTATIN CALCIUM 2 MG PO TABS
2.0000 mg | ORAL_TABLET | Freq: Every day | ORAL | 3 refills | Status: DC
  Filled 2022-09-17: qty 90, 90d supply, fill #0

## 2022-09-27 ENCOUNTER — Other Ambulatory Visit: Payer: Self-pay

## 2022-09-27 MED ORDER — METOPROLOL SUCCINATE ER 25 MG PO TB24
25.0000 mg | ORAL_TABLET | Freq: Every day | ORAL | 3 refills | Status: DC
  Filled 2022-09-27: qty 90, 90d supply, fill #0

## 2022-10-10 ENCOUNTER — Other Ambulatory Visit: Payer: Self-pay

## 2022-11-28 ENCOUNTER — Other Ambulatory Visit: Payer: Self-pay

## 2022-11-28 MED ORDER — ASPIRIN 81 MG PO TBEC: 81.0000 mg | DELAYED_RELEASE_TABLET | Freq: Every day | ORAL | 3 refills | Status: AC

## 2022-11-28 MED ORDER — MELOXICAM 15 MG PO TABS
15.0000 mg | ORAL_TABLET | Freq: Every day | ORAL | 3 refills | Status: DC
  Filled 2022-11-28: qty 90, 90d supply, fill #0

## 2022-12-23 ENCOUNTER — Other Ambulatory Visit: Payer: Self-pay

## 2022-12-23 MED ORDER — PITAVASTATIN CALCIUM 2 MG PO TABS
2.0000 mg | ORAL_TABLET | Freq: Every day | ORAL | 3 refills | Status: DC
  Filled 2022-12-23: qty 90, 90d supply, fill #0

## 2022-12-23 MED ORDER — METOPROLOL SUCCINATE ER 25 MG PO TB24
25.0000 mg | ORAL_TABLET | Freq: Every day | ORAL | 3 refills | Status: DC
  Filled 2022-12-23: qty 90, 90d supply, fill #0

## 2023-02-04 DIAGNOSIS — R5383 Other fatigue: Secondary | ICD-10-CM | POA: Diagnosis not present

## 2023-02-07 LAB — LAB REPORT - SCANNED
A1c: 5.5
EGFR: 89

## 2023-02-14 ENCOUNTER — Other Ambulatory Visit: Payer: Self-pay

## 2023-02-14 MED ORDER — OMEPRAZOLE 40 MG PO CPDR
40.0000 mg | DELAYED_RELEASE_CAPSULE | Freq: Two times a day (BID) | ORAL | 3 refills | Status: DC
  Filled 2023-02-14: qty 180, 90d supply, fill #0

## 2023-02-27 ENCOUNTER — Encounter: Payer: Self-pay | Admitting: Dermatology

## 2023-02-27 ENCOUNTER — Ambulatory Visit: Payer: 59 | Admitting: Dermatology

## 2023-02-27 VITALS — BP 137/88

## 2023-02-27 DIAGNOSIS — Z1283 Encounter for screening for malignant neoplasm of skin: Secondary | ICD-10-CM

## 2023-02-27 DIAGNOSIS — D1801 Hemangioma of skin and subcutaneous tissue: Secondary | ICD-10-CM

## 2023-02-27 DIAGNOSIS — L820 Inflamed seborrheic keratosis: Secondary | ICD-10-CM

## 2023-02-27 DIAGNOSIS — L821 Other seborrheic keratosis: Secondary | ICD-10-CM

## 2023-02-27 DIAGNOSIS — L578 Other skin changes due to chronic exposure to nonionizing radiation: Secondary | ICD-10-CM

## 2023-02-27 DIAGNOSIS — W908XXA Exposure to other nonionizing radiation, initial encounter: Secondary | ICD-10-CM | POA: Diagnosis not present

## 2023-02-27 DIAGNOSIS — L814 Other melanin hyperpigmentation: Secondary | ICD-10-CM

## 2023-02-27 DIAGNOSIS — Z85828 Personal history of other malignant neoplasm of skin: Secondary | ICD-10-CM

## 2023-02-27 DIAGNOSIS — Z86018 Personal history of other benign neoplasm: Secondary | ICD-10-CM | POA: Diagnosis not present

## 2023-02-27 DIAGNOSIS — D229 Melanocytic nevi, unspecified: Secondary | ICD-10-CM

## 2023-02-27 DIAGNOSIS — Z872 Personal history of diseases of the skin and subcutaneous tissue: Secondary | ICD-10-CM

## 2023-02-27 DIAGNOSIS — L738 Other specified follicular disorders: Secondary | ICD-10-CM | POA: Diagnosis not present

## 2023-02-27 NOTE — Patient Instructions (Addendum)
Cryotherapy Aftercare  Wash gently with soap and water everyday.   Apply Vaseline and Band-Aid daily until healed.     Due to recent changes in healthcare laws, you may see results of your pathology and/or laboratory studies on MyChart before the doctors have had a chance to review them. We understand that in some cases there may be results that are confusing or concerning to you. Please understand that not all results are received at the same time and often the doctors may need to interpret multiple results in order to provide you with the best plan of care or course of treatment. Therefore, we ask that you please give us 2 business days to thoroughly review all your results before contacting the office for clarification. Should we see a critical lab result, you will be contacted sooner.   If You Need Anything After Your Visit  If you have any questions or concerns for your doctor, please call our main line at [phone] and press option 4 to reach your doctor's medical assistant. If no one answers, please leave a voicemail as directed and we will return your call as soon as possible. Messages left after 4 pm will be answered the following business day.   You may also send us a message via MyChart. We typically respond to MyChart messages within 1-2 business days.  For prescription refills, please ask your pharmacy to contact our office. Our fax number is [phone].  If you have an urgent issue when the clinic is closed that cannot wait until the next business day, you can page your doctor at the number below.    Please note that while we do our best to be available for urgent issues outside of office hours, we are not available 24/7.   If you have an urgent issue and are unable to reach us, you may choose to seek medical care at your doctor's office, retail clinic, urgent care center, or emergency room.  If you have a medical emergency, please immediately call 911 or go to the  emergency department.  Pager Numbers  - Dr. Kowalski: [phone]  - Dr. Moye: [phone]  - Dr. Stewart: [phone]  In the event of inclement weather, please call our main line at [phone] for an update on the status of any delays or closures.  Dermatology Medication Tips: Please keep the boxes that topical medications come in in order to help keep track of the instructions about where and how to use these. Pharmacies typically print the medication instructions only on the boxes and not directly on the medication tubes.   If your medication is too expensive, please contact our office at [phone] option 4 or send us a message through MyChart.   We are unable to tell what your co-pay for medications will be in advance as this is different depending on your insurance coverage. However, we may be able to find a substitute medication at lower cost or fill out paperwork to get insurance to cover a needed medication.   If a prior authorization is required to get your medication covered by your insurance company, please allow us 1-2 business days to complete this process.  Drug prices often vary depending on where the prescription is filled and some pharmacies may offer cheaper prices.  The website www.goodrx.com contains coupons for medications through different pharmacies. The prices here do not account for what the cost may be with help from insurance (it may be cheaper with your insurance), but the website can   give you the price if you did not use any insurance.  - You can print the associated coupon and take it with your prescription to the pharmacy.  - You may also stop by our office during regular business hours and pick up a GoodRx coupon card.  - If you need your prescription sent electronically to a different pharmacy, notify our office through [facility] MyChart or by phone at [phone] option 4.     Si Usted Necesita Algo Despus de Su Visita  Tambin puede  enviarnos un mensaje a travs de MyChart. Por lo general respondemos a los mensajes de MyChart en el transcurso de 1 a 2 das hbiles.  Para renovar recetas, por favor pida a su farmacia que se ponga en contacto con nuestra oficina. Nuestro nmero de fax es el [phone].  Si tiene un asunto urgente cuando la clnica est cerrada y que no puede esperar hasta el siguiente da hbil, puede llamar/localizar a su doctor(a) al nmero que aparece a continuacin.   Por favor, tenga en cuenta que aunque hacemos todo lo posible para estar disponibles para asuntos urgentes fuera del horario de oficina, no estamos disponibles las 24 horas del da, los 7 das de la semana.   Si tiene un problema urgente y no puede comunicarse con nosotros, puede optar por buscar atencin mdica  en el consultorio de su doctor(a), en una clnica privada, en un centro de atencin urgente o en una sala de emergencias.  Si tiene una emergencia mdica, por favor llame inmediatamente al 911 o vaya a la sala de emergencias.  Nmeros de bper  - Dr. Kowalski: [phone]  - Dra. Moye: [phone]  - Dra. Stewart: [phone]  En caso de inclemencias del tiempo, por favor llame a nuestra lnea principal al [phone] para una actualizacin sobre el estado de cualquier retraso o cierre.  Consejos para la medicacin en dermatologa: Por favor, guarde las cajas en las que vienen los medicamentos de uso tpico para ayudarle a seguir las instrucciones sobre dnde y cmo usarlos. Las farmacias generalmente imprimen las instrucciones del medicamento slo en las cajas y no directamente en los tubos del medicamento.   Si su medicamento es muy caro, por favor, pngase en contacto con nuestra oficina llamando al [phone] y presione la opcin 4 o envenos un mensaje a travs de MyChart.   No podemos decirle cul ser su copago por los medicamentos por adelantado ya que esto es diferente dependiendo de la cobertura de su seguro.  Sin embargo, es posible que podamos encontrar un medicamento sustituto a menor costo o llenar un formulario para que el seguro cubra el medicamento que se considera necesario.   Si se requiere una autorizacin previa para que su compaa de seguros cubra su medicamento, por favor permtanos de 1 a 2 das hbiles para completar este proceso.  Los precios de los medicamentos varan con frecuencia dependiendo del lugar de dnde se surte la receta y alguna farmacias pueden ofrecer precios ms baratos.  El sitio web www.goodrx.com tiene cupones para medicamentos de diferentes farmacias. Los precios aqu no tienen en cuenta lo que podra costar con la ayuda del seguro (puede ser ms barato con su seguro), pero el sitio web puede darle el precio si no utiliz ningn seguro.  - Puede imprimir el cupn correspondiente y llevarlo con su receta a la farmacia.  - Tambin puede pasar por nuestra oficina durante el horario de atencin regular y recoger una tarjeta de cupones de GoodRx.  -   Si necesita que su receta se enve electrnicamente a una farmacia diferente, informe a nuestra oficina a travs de MyChart de [place] o por telfono llamando al [phone] y presione la opcin 4.  

## 2023-02-27 NOTE — Progress Notes (Signed)
Follow-Up Visit   Subjective  Jose Boyer is a 57 y.o. male who presents for the following: Skin Cancer Screening and Full Body Skin Exam, hx of BCC, Dysplastic Nevus, check spot face  The patient presents for Total-Body Skin Exam (TBSE) for skin cancer screening and mole check. The patient has spots, moles and lesions to be evaluated, some may be new or changing and the patient has concerns that these could be cancer.    The following portions of the chart were reviewed this encounter and updated as appropriate: medications, allergies, medical history  Review of Systems:  No other skin or systemic complaints except as noted in HPI or Assessment and Plan.  Objective  Well appearing patient in no apparent distress; mood and affect are within normal limits.  A full examination was performed including scalp, head, eyes, ears, nose, lips, neck, chest, axillae, abdomen, back, buttocks, bilateral upper extremities, bilateral lower extremities, hands, feet, fingers, toes, fingernails, and toenails. All findings within normal limits unless otherwise noted below.   Relevant physical exam findings are noted in the Assessment and Plan.  trunk, scalp, R upper eyelid  x >50 (50) Stuck on waxy paps with erythema  L lower eyelid x 1 Yellow lobulated pap    Assessment & Plan   LENTIGINES, SEBORRHEIC KERATOSES, HEMANGIOMAS - Benign normal skin lesions - Benign-appearing - Call for any changes  MELANOCYTIC NEVI - Tan-brown and/or pink-flesh-colored symmetric macules and papules - Benign appearing on exam today - Observation - Call clinic for new or changing moles - Recommend daily use of broad spectrum spf 30+ sunscreen to sun-exposed areas.   ACTINIC DAMAGE - Chronic condition, secondary to cumulative UV/sun exposure - diffuse scaly erythematous macules with underlying dyspigmentation - Recommend daily broad spectrum sunscreen SPF 30+ to sun-exposed areas, reapply every 2 hours  as needed.  - Staying in the shade or wearing long sleeves, sun glasses (UVA+UVB protection) and wide brim hats (4-inch brim around the entire circumference of the hat) are also recommended for sun protection.  - Call for new or changing lesions.  SKIN CANCER SCREENING PERFORMED TODAY.  HISTORY OF BASAL CELL CARCINOMA OF THE SKIN - No evidence of recurrence today - Recommend regular full body skin exams - Recommend daily broad spectrum sunscreen SPF 30+ to sun-exposed areas, reapply every 2 hours as needed.  - Call if any new or changing lesions are noted between office visits  - R sideburn sup preauricular  HISTORY OF DYSPLASTIC NEVUS No evidence of recurrence today Recommend regular full body skin exams Recommend daily broad spectrum sunscreen SPF 30+ to sun-exposed areas, reapply every 2 hours as needed.  Call if any new or changing lesions are noted between office visits  - L inf buttock inf to the post waistline  HISTORY OF PRECANCEROUS ACTINIC KERATOSIS - site(s) of PreCancerous Actinic Keratosis clear today. - these may recur and new lesions may form requiring treatment to prevent transformation into skin cancer - observe for new or changing spots and contact [facility] for appointment if occur - photoprotection with sun protective clothing; sunglasses and broad spectrum sunscreen with SPF of at least 30 + and frequent self skin exams recommended - yearly exams by a dermatologist recommended for persons with history of PreCancerous Actinic Keratoses   Inflamed seborrheic keratosis (50) trunk, scalp, R upper eyelid  x >50  Symptomatic, irritating, patient would like treated.   Destruction of lesion - trunk, scalp, R upper eyelid  x >50 Complexity:  simple   Destruction method: cryotherapy   Informed consent: discussed and consent obtained   Timeout:  patient name, date of birth, surgical site, and procedure verified Lesion destroyed using liquid nitrogen: Yes    Region frozen until ice ball extended beyond lesion: Yes   Outcome: patient tolerated procedure well with no complications   Post-procedure details: wound care instructions given    Sebaceous hyperplasia L lower eyelid x 1  Destruction of lesion - L lower eyelid x 1 Complexity: simple   Destruction method: cryotherapy   Informed consent: discussed and consent obtained   Timeout:  patient name, date of birth, surgical site, and procedure verified Lesion destroyed using liquid nitrogen: Yes   Region frozen until ice ball extended beyond lesion: Yes   Outcome: patient tolerated procedure well with no complications   Post-procedure details: wound care instructions given     Return in about 6 months (around 08/29/2023) for UBSE, Hx of BCC, Hx of AKs, Hx of Dysplastic nevi.  I, Ardis Rowan, RMA, am acting as scribe for Armida Sans, MD .   Documentation: I have reviewed the above documentation for accuracy and completeness, and I agree with the above.  Armida Sans, MD

## 2023-03-02 ENCOUNTER — Encounter: Payer: Self-pay | Admitting: Dermatology

## 2023-03-25 ENCOUNTER — Other Ambulatory Visit: Payer: Self-pay

## 2023-03-25 ENCOUNTER — Other Ambulatory Visit: Payer: Self-pay | Admitting: Internal Medicine

## 2023-03-25 DIAGNOSIS — I10 Essential (primary) hypertension: Secondary | ICD-10-CM

## 2023-03-25 MED ORDER — TELMISARTAN 20 MG PO TABS
20.0000 mg | ORAL_TABLET | Freq: Every day | ORAL | 0 refills | Status: DC
  Filled 2023-03-25: qty 30, 30d supply, fill #0

## 2023-03-25 MED ORDER — METOPROLOL SUCCINATE ER 25 MG PO TB24
25.0000 mg | ORAL_TABLET | Freq: Every day | ORAL | 3 refills | Status: DC
  Filled 2023-03-25: qty 90, 90d supply, fill #0

## 2023-03-25 MED ORDER — MELOXICAM 15 MG PO TABS
15.0000 mg | ORAL_TABLET | Freq: Every day | ORAL | 3 refills | Status: DC
  Filled 2023-03-25: qty 90, 90d supply, fill #0

## 2023-03-25 MED ORDER — EDARBI 40 MG PO TABS: 1.0000 | ORAL_TABLET | Freq: Every day | ORAL | 1 refills | Status: DC

## 2023-03-25 MED ORDER — PITAVASTATIN CALCIUM 2 MG PO TABS
2.0000 mg | ORAL_TABLET | Freq: Every day | ORAL | 3 refills | Status: DC
  Filled 2023-03-25: qty 90, 90d supply, fill #0

## 2023-03-25 NOTE — Assessment & Plan Note (Signed)
Patient has new onset hypertension .  Adding telmisartan at starting dose of 20 mg at bedtime.  BMET and urine microalb/cr ratio ordered/. He is requesting  eventual tx with [place] via mail order

## 2023-04-02 ENCOUNTER — Telehealth: Payer: Self-pay | Admitting: Internal Medicine

## 2023-04-02 DIAGNOSIS — I10 Essential (primary) hypertension: Secondary | ICD-10-CM

## 2023-04-02 NOTE — Telephone Encounter (Signed)
Patient need lab orders.

## 2023-04-03 ENCOUNTER — Other Ambulatory Visit (INDEPENDENT_AMBULATORY_CARE_PROVIDER_SITE_OTHER): Payer: 59

## 2023-04-03 DIAGNOSIS — I10 Essential (primary) hypertension: Secondary | ICD-10-CM | POA: Diagnosis not present

## 2023-04-04 ENCOUNTER — Telehealth: Payer: Self-pay

## 2023-04-04 NOTE — Telephone Encounter (Signed)
Pharmacy Patient Advocate Encounter  Received notification from [facility] that Prior Authorization for Edarbi 40MG  tablets has been DENIED. Please advise how you'd like to proceed. Full denial letter will be uploaded to the media tab. See denial reason below.  The requested drug did not meet our guideline rules. To get approved, your doctor needs to show that you have met the guidelines: When used for the treatment of hypertension, the plan requires that you have met ONE (1) of the following criteria for approval. 1) You have previously tried an angiotensin-converting enzyme inhibitor (ACE inhibitor), an angiotensin-converting enzyme inhibitor combination, and angiotensin  recepter blocker (ARB), or an angiotensin  receptor blocker combination OR 2) You have a valid medical reason why (a contraindication to) you cannot try the alternatives listed above.   PA #/Case ID/Reference #: [id number]   Please be advised we currently do not have a Pharmacist to review denials, therefore you will need to process appeals accordingly as needed. Thanks for your support at this time. Contact for appeals are as follows: Phone: [phone], Fax: [phone]

## 2023-04-07 NOTE — Telephone Encounter (Signed)
Pt is aware. pt paying out of pocket.

## 2023-04-22 ENCOUNTER — Other Ambulatory Visit: Payer: Self-pay

## 2023-04-22 MED ORDER — TELMISARTAN 20 MG PO TABS
20.0000 mg | ORAL_TABLET | Freq: Every day | ORAL | 2 refills | Status: DC
  Filled 2023-04-22: qty 90, 90d supply, fill #0

## 2023-04-29 ENCOUNTER — Other Ambulatory Visit: Payer: Self-pay | Admitting: Internal Medicine

## 2023-04-29 ENCOUNTER — Telehealth: Payer: Self-pay | Admitting: Internal Medicine

## 2023-04-29 DIAGNOSIS — E785 Hyperlipidemia, unspecified: Secondary | ICD-10-CM

## 2023-04-29 DIAGNOSIS — I10 Essential (primary) hypertension: Secondary | ICD-10-CM

## 2023-04-29 DIAGNOSIS — R0789 Other chest pain: Secondary | ICD-10-CM

## 2023-04-29 NOTE — Telephone Encounter (Signed)
Patient states he is returning a call from [facility] regarding a CT scan.  I was unable to reach [name] at the time of the call.  Patient states he will continue to try to reach her at the number she left for him.

## 2023-04-29 NOTE — Telephone Encounter (Signed)
Lft pt vm to call ofc to sch CT. thanks 

## 2023-04-30 ENCOUNTER — Telehealth: Payer: Self-pay | Admitting: Internal Medicine

## 2023-04-30 ENCOUNTER — Ambulatory Visit
Admission: RE | Admit: 2023-04-30 | Discharge: 2023-04-30 | Disposition: A | Discharge status: Home / Self Care | Payer: 59 | Source: Ambulatory Visit | Attending: Internal Medicine | Admitting: Internal Medicine

## 2023-04-30 VITALS — BP 118/88 | HR 66 | Resp 18

## 2023-04-30 DIAGNOSIS — I712 Thoracic aortic aneurysm, without rupture, unspecified: Secondary | ICD-10-CM | POA: Insufficient documentation

## 2023-04-30 DIAGNOSIS — R0789 Other chest pain: Secondary | ICD-10-CM

## 2023-04-30 MED ORDER — METOPROLOL TARTRATE 5 MG/5ML IV SOLN
10.0000 mg | Freq: Once | INTRAVENOUS | Status: AC
  Administered 2023-04-30: 10 mg via INTRAVENOUS

## 2023-04-30 MED ORDER — METOPROLOL TARTRATE 5 MG/5ML IV SOLN
INTRAVENOUS | Status: AC
  Filled 2023-04-30: qty 5

## 2023-04-30 MED ORDER — IOHEXOL 350 MG/ML SOLN
80.0000 mL | Freq: Once | INTRAVENOUS | Status: AC | PRN
  Administered 2023-04-30: 80 mL via INTRAVENOUS

## 2023-04-30 MED ORDER — NITROGLYCERIN 0.4 MG SL SUBL
0.8000 mg | SUBLINGUAL_TABLET | Freq: Once | SUBLINGUAL | Status: AC
  Administered 2023-04-30: 0.8 mg via SUBLINGUAL

## 2023-04-30 NOTE — Progress Notes (Signed)
Pt tolerated procedure well with no issues. Pt ABCs intact. Pt denies any complaints. Pt encouraged to drink plenty of water throughout the day. Pt ambulatory with steady gait.  

## 2023-04-30 NOTE — Telephone Encounter (Signed)
Lft pt vm to call ofc to confirm scheduling at [facility]. Thanks

## 2023-05-01 ENCOUNTER — Other Ambulatory Visit: Payer: Self-pay

## 2023-05-01 DIAGNOSIS — K219 Gastro-esophageal reflux disease without esophagitis: Secondary | ICD-10-CM

## 2023-05-01 DIAGNOSIS — Z86010 Personal history of colonic polyps: Secondary | ICD-10-CM

## 2023-05-02 ENCOUNTER — Other Ambulatory Visit: Payer: Self-pay | Admitting: Internal Medicine

## 2023-05-02 ENCOUNTER — Other Ambulatory Visit: Payer: Self-pay

## 2023-05-02 DIAGNOSIS — I712 Thoracic aortic aneurysm, without rupture, unspecified: Secondary | ICD-10-CM | POA: Insufficient documentation

## 2023-05-02 DIAGNOSIS — I7121 Aneurysm of the ascending aorta, without rupture: Secondary | ICD-10-CM

## 2023-05-02 DIAGNOSIS — I10 Essential (primary) hypertension: Secondary | ICD-10-CM

## 2023-05-02 DIAGNOSIS — R0789 Other chest pain: Secondary | ICD-10-CM

## 2023-05-02 DIAGNOSIS — Q231 Congenital insufficiency of aortic valve: Secondary | ICD-10-CM

## 2023-05-02 NOTE — Assessment & Plan Note (Signed)
Advised to increase morning dose of telmisartan to 40 mg.  Continue toprol 25 mg at noon and 20 mg telmisartan at bedtime.  Goal BP < 120/70 given 4.5 cm thoracic AA .

## 2023-05-02 NOTE — Assessment & Plan Note (Addendum)
CT Coronary  Morph with CTA  done August 2024:  calcium score ZERO.   BICUSPID AORTIC VALVE.

## 2023-05-02 NOTE — Assessment & Plan Note (Signed)
Referring to Dossie Arbour for ECHO and Festus Barren for follow up

## 2023-05-02 NOTE — Assessment & Plan Note (Signed)
Coronay calcium score is zero .  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

## 2023-05-05 ENCOUNTER — Telehealth: Payer: Self-pay | Admitting: Emergency Medicine

## 2023-05-05 ENCOUNTER — Encounter: Payer: Self-pay | Admitting: Gastroenterology

## 2023-05-05 ENCOUNTER — Telehealth: Payer: Self-pay | Admitting: Cardiovascular Disease

## 2023-05-05 DIAGNOSIS — I712 Thoracic aortic aneurysm, without rupture, unspecified: Secondary | ICD-10-CM

## 2023-05-05 NOTE — Telephone Encounter (Signed)
Left voice mail, pt needs to be scheduled for echo.

## 2023-05-05 NOTE — Telephone Encounter (Signed)
Per Dr. Mariah Milling.  echo for aortic valve disorder and dailted ascending aorta

## 2023-05-06 ENCOUNTER — Encounter: Payer: Self-pay | Admitting: Gastroenterology

## 2023-05-06 NOTE — Anesthesia Preprocedure Evaluation (Addendum)
Anesthesia Evaluation  Patient identified by MRN, date of birth, ID band Patient awake    Reviewed: Allergy & Precautions, H&P , NPO status , Patient's Chart, lab work & pertinent test results  Airway Mallampati: III  TM Distance: >3 FB Neck ROM: Full    Dental no notable dental hx.    Pulmonary neg pulmonary ROS   Pulmonary exam normal breath sounds clear to auscultation       Cardiovascular hypertension, Pt. on home beta blockers and Pt. on medications Normal cardiovascular exam Rhythm:Regular Rate:Normal  04-30-23 CT  4.5 cm ascending thoracic aortic aneurysm.  Recommend semi-annual imaging followup by CTA or MRA and referral to cardiothoracic surgery if not already obtained.  No CAD noted CAD-RADS-0   Bicuspid aortic valve   Neuro/Psych  Neuromuscular disease negative neurological ROS  negative psych ROS   GI/Hepatic Neg liver ROS,GERD  ,,  Endo/Other  negative endocrine ROSdiabetes    Renal/GU negative Renal ROS  negative genitourinary   Musculoskeletal negative musculoskeletal ROS (+) Arthritis ,    Abdominal   Peds negative pediatric ROS (+)  Hematology negative hematology ROS (+)   Anesthesia Other Findings Hyperlipemia  GERD (gastroesophageal reflux disease) Contact lens/glasses fitting  Right rotator cuff tear Impingement syndrome of right shoulder History of migraine Closed T12 fracture (HCC)  Basal cell carcinoma Atypical mole  Actinic keratosis Hypertension  Thoracic aortic aneurysm Bicuspid aortic valve    Reproductive/Obstetrics negative OB ROS                              Anesthesia Physical Anesthesia Plan  ASA: 3  Anesthesia Plan: General   Post-op Pain Management:    Induction: Intravenous  PONV Risk Score and Plan:   Airway Management Planned: Natural Airway and Nasal Cannula  Additional Equipment:   Intra-op Plan:   Post-operative Plan:    Informed Consent: I have reviewed the patients History and Physical, chart, labs and discussed the procedure including the risks, benefits and alternatives for the proposed anesthesia with the patient or authorized representative who has indicated his/her understanding and acceptance.     Dental Advisory Given  Plan Discussed with: Anesthesiologist, CRNA and Surgeon  Anesthesia Plan Comments: (Patient consented for risks of anesthesia including but not limited to:  - adverse reactions to medications - risk of airway placement if required - damage to eyes, teeth, lips or other oral mucosa - nerve damage due to positioning  - sore throat or hoarseness - Damage to heart, brain, nerves, lungs, other parts of body or loss of life  Patient voiced understanding.)         Anesthesia Quick Evaluation

## 2023-05-09 ENCOUNTER — Ambulatory Visit: Payer: 59 | Admitting: Anesthesiology

## 2023-05-09 ENCOUNTER — Ambulatory Visit
Admission: RE | Admit: 2023-05-09 | Discharge: 2023-05-09 | Disposition: A | Discharge status: Home / Self Care | Payer: 59 | Attending: Gastroenterology | Admitting: Gastroenterology

## 2023-05-09 ENCOUNTER — Encounter
Admission: RE | Disposition: A | Discharge status: Home / Self Care | Payer: Self-pay | Source: Home / Self Care | Attending: Gastroenterology

## 2023-05-09 ENCOUNTER — Other Ambulatory Visit: Payer: Self-pay

## 2023-05-09 ENCOUNTER — Encounter: Payer: Self-pay | Admitting: Gastroenterology

## 2023-05-09 DIAGNOSIS — K219 Gastro-esophageal reflux disease without esophagitis: Secondary | ICD-10-CM | POA: Diagnosis not present

## 2023-05-09 DIAGNOSIS — I10 Essential (primary) hypertension: Secondary | ICD-10-CM | POA: Diagnosis not present

## 2023-05-09 DIAGNOSIS — K317 Polyp of stomach and duodenum: Secondary | ICD-10-CM | POA: Insufficient documentation

## 2023-05-09 DIAGNOSIS — K635 Polyp of colon: Secondary | ICD-10-CM | POA: Diagnosis not present

## 2023-05-09 DIAGNOSIS — Z85828 Personal history of other malignant neoplasm of skin: Secondary | ICD-10-CM | POA: Diagnosis not present

## 2023-05-09 DIAGNOSIS — D122 Benign neoplasm of ascending colon: Secondary | ICD-10-CM | POA: Diagnosis not present

## 2023-05-09 DIAGNOSIS — K641 Second degree hemorrhoids: Secondary | ICD-10-CM | POA: Diagnosis not present

## 2023-05-09 DIAGNOSIS — Z1211 Encounter for screening for malignant neoplasm of colon: Secondary | ICD-10-CM | POA: Insufficient documentation

## 2023-05-09 DIAGNOSIS — E785 Hyperlipidemia, unspecified: Secondary | ICD-10-CM | POA: Insufficient documentation

## 2023-05-09 DIAGNOSIS — R12 Heartburn: Secondary | ICD-10-CM | POA: Diagnosis not present

## 2023-05-09 DIAGNOSIS — Z86010 Personal history of colonic polyps: Secondary | ICD-10-CM | POA: Diagnosis not present

## 2023-05-09 DIAGNOSIS — G709 Myoneural disorder, unspecified: Secondary | ICD-10-CM | POA: Insufficient documentation

## 2023-05-09 HISTORY — PX: ESOPHAGOGASTRODUODENOSCOPY (EGD) WITH PROPOFOL: SHX5813

## 2023-05-09 HISTORY — PX: POLYPECTOMY: SHX5525

## 2023-05-09 HISTORY — DX: Thoracic aortic aneurysm, without rupture, unspecified: I71.20

## 2023-05-09 HISTORY — DX: Essential (primary) hypertension: I10

## 2023-05-09 HISTORY — DX: Congenital insufficiency of aortic valve: Q23.1

## 2023-05-09 HISTORY — DX: Bicuspid aortic valve: Q23.81

## 2023-05-09 HISTORY — PX: COLONOSCOPY WITH PROPOFOL: SHX5780

## 2023-05-09 SURGERY — COLONOSCOPY WITH PROPOFOL
Anesthesia: General | Site: Rectum

## 2023-05-09 MED ORDER — STERILE WATER FOR IRRIGATION IR SOLN
Status: DC | PRN
  Administered 2023-05-09: .05 mL

## 2023-05-09 MED ORDER — LIDOCAINE HCL (CARDIAC) PF 100 MG/5ML IV SOSY
PREFILLED_SYRINGE | INTRAVENOUS | Status: DC | PRN
  Administered 2023-05-09: 80 mg via INTRAVENOUS

## 2023-05-09 MED ORDER — SODIUM CHLORIDE 0.9 % IV SOLN: INTRAVENOUS | Status: DC

## 2023-05-09 MED ORDER — PROPOFOL 10 MG/ML IV BOLUS
INTRAVENOUS | Status: DC | PRN
  Administered 2023-05-09 (×3): 25 mg via INTRAVENOUS
  Administered 2023-05-09: 100 mg via INTRAVENOUS
  Administered 2023-05-09 (×3): 25 mg via INTRAVENOUS
  Administered 2023-05-09: 50 mg via INTRAVENOUS

## 2023-05-09 MED ORDER — LACTATED RINGERS IV SOLN: INTRAVENOUS | Status: DC

## 2023-05-09 MED ORDER — STERILE WATER FOR IRRIGATION IR SOLN
Status: DC | PRN
  Administered 2023-05-09: 100 mL

## 2023-05-09 SURGICAL SUPPLY — 10 items
BLOCK BITE 60FR ADLT L/F GRN (MISCELLANEOUS) ×3 IMPLANT
FORCEPS BIOP RAD 4 LRG CAP 4 (CUTTING FORCEPS) IMPLANT
GOWN CVR UNV OPN BCK APRN NK (MISCELLANEOUS) ×6 IMPLANT
GOWN ISOL THUMB LOOP REG UNIV (MISCELLANEOUS) ×6
KIT PRC NS LF DISP ENDO (KITS) ×3 IMPLANT
KIT PROCEDURE OLYMPUS (KITS) ×3
MANIFOLD NEPTUNE II (INSTRUMENTS) ×3 IMPLANT
SNARE COLD EXACTO (MISCELLANEOUS) IMPLANT
TRAP ETRAP POLY (MISCELLANEOUS) IMPLANT
WATER STERILE IRR 250ML POUR (IV SOLUTION) ×3 IMPLANT

## 2023-05-09 NOTE — Transfer of Care (Signed)
Immediate Anesthesia Transfer of Care Note  Patient: Jose Boyer  Procedure(s) Performed: COLONOSCOPY WITH PROPOFOL (Rectum) ESOPHAGOGASTRODUODENOSCOPY (EGD) WITH PROPOFOL (Mouth) POLYPECTOMY (Rectum)  Patient Location: PACU  Anesthesia Type: General  Level of Consciousness: awake, alert  and patient cooperative  Airway and Oxygen Therapy: Patient Spontanous Breathing and Patient connected to supplemental oxygen  Post-op Assessment: Post-op Vital signs reviewed, Patient's Cardiovascular Status Stable, Respiratory Function Stable, Patent Airway and No signs of Nausea or vomiting  Post-op Vital Signs: Reviewed and stable  Complications: No notable events documented.

## 2023-05-09 NOTE — H&P (Signed)
Midge Minium, MD [facility] [address] [place], [place] [address] Phone:[phone] Fax : [phone]  Primary Care Physician:  Sherlene Shams, MD Primary Gastroenterologist:  Dr. Servando Snare  Pre-Procedure History & Physical: HPI:  AREEB KORBEL is a 57 y.o. male is here for an endoscopy and colonoscopy.   Past Medical History:  Diagnosis Date   Actinic keratosis    Atypical mole 09/15/2018   L inferior buttock  inf to the post waistline    Basal cell carcinoma 10/20/2018   Right sideburn sup preauricular    Bicuspid aortic valve determined by imaging    Closed T12 fracture (HCC) 2005   Contact lens/glasses fitting    wears contacts or glasses   GERD (gastroesophageal reflux disease)    History of migraine    Hyperlipemia    Hypertension    Impingement syndrome of right shoulder 08/28/2012   Right rotator cuff tear 08/28/2012   Thoracic aortic aneurysm [facility])     Past Surgical History:  Procedure Laterality Date   APPENDECTOMY  4/13   arh   COLONOSCOPY     x2   COLONOSCOPY WITH PROPOFOL N/A 03/23/2018   Procedure: COLONOSCOPY WITH PROPOFOL;  Surgeon: Midge Minium, MD;  Location: [facility];  Service: Endoscopy;  Laterality: N/A;   ESOPHAGOGASTRODUODENOSCOPY (EGD) WITH PROPOFOL N/A 03/23/2018   Procedure: ESOPHAGOGASTRODUODENOSCOPY (EGD) WITH PROPOFOL;  Surgeon: Midge Minium, MD;  Location: [facility];  Service: Endoscopy;  Laterality: N/A;   POLYPECTOMY N/A 03/23/2018   Procedure: POLYPECTOMY;  Surgeon: Midge Minium, MD;  Location: [facility];  Service: Endoscopy;  Laterality: N/A;   SHOULDER ARTHROSCOPY WITH ROTATOR CUFF REPAIR AND SUBACROMIAL DECOMPRESSION  08/28/2012   Procedure: SHOULDER ARTHROSCOPY WITH ROTATOR CUFF REPAIR AND SUBACROMIAL DECOMPRESSION;  Surgeon: Eulas Post, MD;  Location: [facility];  Service: Orthopedics;  Laterality: Right;  RIGHT SHOULDER DEBRIDEMENT LIMITED, ARTHROSCOPY SHJOULDER DECOMPRESSION  SUBACROMIAL PARTIAL ACROMIOPLASTY WITH CORACOACROMIAL RELEASE, ARTHROSCOPY SHOULDER WITH ROTATOR CUFF REPAIR   UPPER GI ENDOSCOPY     x2    Prior to Admission medications   Medication Sig Start Date End Date Taking? Authorizing Provider  aspirin EC 81 MG tablet Take 1 tablet (81 mg total) by mouth daily. 11/28/22  Yes Vaught, Roney Mans, MD  metoprolol succinate (TOPROL-XL) 25 MG 24 hr tablet Take one tablet by mouth daily 12/23/22  Yes   metoprolol succinate (TOPROL-XL) 25 MG 24 hr tablet Take one tablet by mouth daily 03/25/23  Yes   omeprazole (PRILOSEC) 40 MG capsule Take 1 capsule (40 mg total) by mouth 2 (two) times daily. 02/14/23  Yes   Pitavastatin Calcium (LIVALO) 2 MG TABS Take 1 tablet by mouth every day. 12/23/22  Yes   Pitavastatin Calcium (LIVALO) 2 MG TABS Take 1 tablet (2 mg total) by mouth daily. 03/25/23  Yes   telmisartan (MICARDIS) 20 MG tablet Take 1 tablet (20 mg total) by mouth daily. Patient taking differently: Take 20 mg by mouth daily. 40 mg AM, 20 mg PM 04/22/23  Yes   COVID-19 At Home Antigen Test [facility] COVID-19 HOME TEST) KIT use as directed within package instructions 01/22/21   Holli Humbles, RPH  meloxicam (MOBIC) 15 MG tablet Take 1 tablet (15 mg total) by mouth daily. Patient not taking: Reported on 05/05/2023 11/28/22     meloxicam (MOBIC) 15 MG tablet Take one tablet by mouth daily Patient not taking: Reported on 05/05/2023 03/25/23     omeprazole (PRILOSEC) 40 MG capsule  TAKE 1 TABLET BY MOUTH TWICE DAILY Patient not taking: Reported on 02/27/2023 06/17/22     Pitavastatin Calcium (LIVALO) 2 MG TABS Take 1 tablet (2 mg total) by mouth daily. Patient not taking: Reported on 02/27/2023 09/17/22     metoprolol tartrate (LOPRESSOR) 25 MG tablet Take one tablet by mouth daily 08/15/21 08/16/21      Allergies as of 05/01/2023   (No Known Allergies)    Family History  Problem Relation Age of Onset   Coronary artery disease Father 57       died at age 97     Social History   Socioeconomic History   Marital status: Married    Spouse name: Carollee Herter   Number of children: 3   Years of education: 25   Highest education level: Professional school degree (e.g., MD, DDS, DVM, JD)  Occupational History   Occupation: Careers adviser  Tobacco Use   Smoking status: Never   Smokeless tobacco: Never  Vaping Use   Vaping status: Never Used  Substance and Sexual Activity   Alcohol use: Yes    Alcohol/week: 4.0 standard drinks of alcohol    Types: 4 Shots of liquor per week    Comment: occ   Drug use: No   Sexual activity: Not on file  Other Topics Concern   Not on file  Social History Narrative   Not on file   Social Determinants of Health   Financial Resource Strain: Not on file  Food Insecurity: Not on file  Transportation Needs: Not on file  Physical Activity: Sufficiently Active (11/01/2017)   Exercise Vital Sign    Days of Exercise per Week: 6 days    Minutes of Exercise per Session: 60 min  Stress: No Stress Concern Present (11/01/2017)   [employer] of Occupational Health - Occupational Stress Questionnaire    Feeling of Stress : Only a little  Social Connections: Not on file  Intimate Partner Violence: Not on file    Review of Systems: See HPI, otherwise negative ROS  Physical Exam: BP (!) 126/92   Pulse 82   Temp (!) 97.3 F (36.3 C) (Temporal)   Resp 12   Ht 6' (1.829 m)   Wt 74.8 kg   SpO2 99%   BMI 22.38 kg/m  General:   Alert,  pleasant and cooperative in NAD Head:  Normocephalic and atraumatic. Neck:  Supple; no masses or thyromegaly. Lungs:  Clear throughout to auscultation.    Heart:  Regular rate and rhythm. Abdomen:  Soft, nontender and nondistended. Normal bowel sounds, without guarding, and without rebound.   Neurologic:  Alert and  oriented x4;  grossly normal neurologically.  Impression/Plan: DENZELLE FARD is here for an endoscopy and colonoscopy to be performed for GERD and a history of  adenomatous polyps on  Risks, benefits, limitations, and alternatives regarding  endoscopy and colonoscopy have been reviewed with the patient.  Questions have been answered.  All parties agreeable.   Midge Minium, MD  05/09/2023, 8:56 AM

## 2023-05-09 NOTE — Op Note (Signed)
[facility] Gastroenterology Patient Name: Jose Boyer Procedure Date: 05/09/2023 9:02 AM MRN: [id number] Account #: [id number] Date of Birth: Apr 13, 1966 Admit Type: Outpatient Age: 57 Room: [facility] OR ROOM 01 Gender: Male Note Status: Finalized Instrument Name: [id number] Procedure:             Colonoscopy Indications:           High risk colon cancer surveillance: Personal history                         of colonic polyps Providers:             Midge Minium MD, MD Referring MD:          Duncan Dull, MD (Referring MD) Medicines:             Propofol per Anesthesia Complications:         No immediate complications. Procedure:             Pre-Anesthesia Assessment:                        - Prior to the procedure, a History and Physical was                         performed, and patient medications and allergies were                         reviewed. The patient's tolerance of previous                         anesthesia was also reviewed. The risks and benefits                         of the procedure and the sedation options and risks                         were discussed with the patient. All questions were                         answered, and informed consent was obtained. Prior                         Anticoagulants: The patient has taken no anticoagulant                         or antiplatelet agents. ASA Grade Assessment: II - A                         patient with mild systemic disease. After reviewing                         the risks and benefits, the patient was deemed in                         satisfactory condition to undergo the procedure.                        After obtaining informed consent, the colonoscope was  passed under direct vision. Throughout the procedure,                         the patient's blood pressure, pulse, and oxygen                         saturations were monitored continuously. The                          Colonoscope was introduced through the anus and                         advanced to the the cecum, identified by appendiceal                         orifice and ileocecal valve. The colonoscopy was                         performed without difficulty. The patient tolerated                         the procedure well. The quality of the bowel                         preparation was excellent. Findings:      The perianal and digital rectal examinations were normal.      A 3 mm polyp was found in the ascending colon. The polyp was sessile.       The polyp was removed with a cold snare. Resection and retrieval were       complete.      Non-bleeding internal hemorrhoids were found during retroflexion. The       hemorrhoids were Grade II (internal hemorrhoids that prolapse but reduce       spontaneously). Impression:            - One 3 mm polyp in the ascending colon, removed with                         a cold snare. Resected and retrieved.                        - Non-bleeding internal hemorrhoids. Recommendation:        - Discharge patient to home.                        - Resume previous diet.                        - Continue present medications.                        - Await pathology results.                        - Repeat colonoscopy in 5 years for surveillance. Procedure Code(s):     --- Professional ---                        [phone], Colonoscopy, flexible; with removal of  tumor(s), polyp(s), or other lesion(s) by snare                         technique Diagnosis Code(s):     --- Professional ---                        Z86.010, Personal history of colonic polyps                        D12.2, Benign neoplasm of ascending colon CPT copyright 2022 American Medical Association. All rights reserved. The codes documented in this report are preliminary and upon coder review may  be revised to meet current compliance requirements. Midge Minium MD, MD 05/09/2023  9:31:28 AM This report has been signed electronically. Number of Addenda: 0 Note Initiated On: 05/09/2023 9:02 AM Scope Withdrawal Time: 0 hours 9 minutes 4 seconds  Total Procedure Duration: 0 hours 12 minutes 16 seconds  Estimated Blood Loss:  Estimated blood loss: none.      [facility]

## 2023-05-09 NOTE — Op Note (Signed)
[facility] Gastroenterology Patient Name: Jose Boyer Procedure Date: 05/09/2023 9:03 AM MRN: [id number] Account #: [id number] Date of Birth: Jun 04, 1966 Admit Type: Outpatient Age: 57 Room: [facility] OR ROOM 01 Gender: Male Note Status: Finalized Instrument Name: [id number] Procedure:             Upper GI endoscopy Indications:           Heartburn Providers:             Midge Minium MD, MD Referring MD:          Duncan Dull, MD (Referring MD) Medicines:             Propofol per Anesthesia Complications:         No immediate complications. Procedure:             Pre-Anesthesia Assessment:                        - Prior to the procedure, a History and Physical was                         performed, and patient medications and allergies were                         reviewed. The patient's tolerance of previous                         anesthesia was also reviewed. The risks and benefits                         of the procedure and the sedation options and risks                         were discussed with the patient. All questions were                         answered, and informed consent was obtained. Prior                         Anticoagulants: The patient has taken no anticoagulant                         or antiplatelet agents. ASA Grade Assessment: II - A                         patient with mild systemic disease. After reviewing                         the risks and benefits, the patient was deemed in                         satisfactory condition to undergo the procedure.                        After obtaining informed consent, the endoscope was                         passed under direct vision. Throughout the procedure,  the patient's blood pressure, pulse, and oxygen                         saturations were monitored continuously. The was                         introduced through the mouth, and advanced to the                          second part of duodenum. The upper GI endoscopy was                         accomplished without difficulty. The patient tolerated                         the procedure well. Findings:      The Z-line was irregular and was found at the gastroesophageal junction.      Multiple pedunculated and sessile polyps with bleeding and no stigmata       of recent bleeding were found in the gastric fundus. Biopsies were taken       with a cold forceps for histology.      The examined duodenum was normal. Impression:            - Z-line irregular, at the gastroesophageal junction.                        - Multiple gastric polyps. Biopsied.                        - Normal examined duodenum. Recommendation:        - Discharge patient to home.                        - Resume previous diet.                        - Continue present medications.                        - Await pathology results.                        - Perform a colonoscopy today. Procedure Code(s):     --- Professional ---                        [phone], Esophagogastroduodenoscopy, flexible,                         transoral; with biopsy, single or multiple Diagnosis Code(s):     --- Professional ---                        R12, Heartburn                        K31.7, Polyp of stomach and duodenum CPT copyright 2022 American Medical Association. All rights reserved. The codes documented in this report are preliminary and upon coder review may  be revised to meet current compliance requirements. Midge Minium MD, MD 05/09/2023 9:15:00 AM This report has been signed electronically. Number  of Addenda: 0 Note Initiated On: 05/09/2023 9:03 AM Total Procedure Duration: 0 hours 3 minutes 19 seconds  Estimated Blood Loss:  Estimated blood loss: none.      [facility]

## 2023-05-10 ENCOUNTER — Encounter: Payer: Self-pay | Admitting: Gastroenterology

## 2023-05-13 NOTE — Anesthesia Postprocedure Evaluation (Signed)
Anesthesia Post Note  Patient: Jose Boyer  Procedure(s) Performed: COLONOSCOPY WITH PROPOFOL (Rectum) ESOPHAGOGASTRODUODENOSCOPY (EGD) WITH PROPOFOL (Mouth) POLYPECTOMY (Rectum)  Patient location during evaluation: PACU Anesthesia Type: General Level of consciousness: awake and alert Pain management: pain level controlled Vital Signs Assessment: post-procedure vital signs reviewed and stable Respiratory status: spontaneous breathing, nonlabored ventilation, respiratory function stable and patient connected to nasal cannula oxygen Cardiovascular status: stable and blood pressure returned to baseline Postop Assessment: no apparent nausea or vomiting Anesthetic complications: no   No notable events documented.   Last Vitals:  Vitals:   05/09/23 0949 05/09/23 0950  BP:  102/81  Pulse: 63 76  Resp: 10 13  Temp:  36.6 C  SpO2: 99% 100%    Last Pain:  Vitals:   05/09/23 0811  TempSrc: Temporal  PainSc: 0-No pain                 Shariya Gaster C Breyon Sigg

## 2023-05-14 ENCOUNTER — Ambulatory Visit (HOSPITAL_COMMUNITY): Payer: 59 | Attending: Cardiovascular Disease

## 2023-05-14 DIAGNOSIS — I712 Thoracic aortic aneurysm, without rupture, unspecified: Secondary | ICD-10-CM | POA: Insufficient documentation

## 2023-05-14 LAB — ECHOCARDIOGRAM COMPLETE
AR max vel: 2.14 cm2
AV Area VTI: 2.41 cm2
AV Area mean vel: 2.21 cm2
AV Mean grad: 8 mmHg
AV Peak grad: 12.4 mmHg
Ao pk vel: 1.76 m/s
Area-P 1/2: 6.54 cm2
S' Lateral: 2.8 cm

## 2023-05-15 ENCOUNTER — Encounter: Payer: Self-pay | Admitting: Cardiovascular Disease

## 2023-05-15 ENCOUNTER — Other Ambulatory Visit: Payer: Self-pay

## 2023-05-15 ENCOUNTER — Ambulatory Visit: Payer: 59 | Attending: Cardiovascular Disease | Admitting: Cardiovascular Disease

## 2023-05-15 VITALS — BP 120/80 | HR 73 | Ht 72.0 in | Wt 165.2 lb

## 2023-05-15 DIAGNOSIS — I712 Thoracic aortic aneurysm, without rupture, unspecified: Secondary | ICD-10-CM

## 2023-05-15 DIAGNOSIS — I10 Essential (primary) hypertension: Secondary | ICD-10-CM

## 2023-05-15 DIAGNOSIS — R002 Palpitations: Secondary | ICD-10-CM

## 2023-05-15 MED ORDER — TELMISARTAN 80 MG PO TABS
80.0000 mg | ORAL_TABLET | Freq: Every day | ORAL | 3 refills | Status: DC
  Filled 2023-05-15: qty 90, 90d supply, fill #0

## 2023-05-15 NOTE — Patient Instructions (Signed)

## 2023-05-15 NOTE — Progress Notes (Signed)
Cardiology Office Note  Date:  05/15/2023   ID:  Jose Boyer, DOB 08/21/1966, MRN [id number]  PCP:  Sherlene Shams, MD   Chief Complaint  Patient presents with   New Patient (Initial Visit)    Follow up Echo & Cardiac CTA. Medications reviewed by the patient verbally.     HPI:  Jose Boyer is a 57 year old gentleman with past medical history of Family history of cardiac disease Hyperlipidemia cardiac CTA detailing bicuspid aortic valve, mild to moderately dilated ascending aorta Who presents to establish care in the [facility] office for his bicuspid aortic valve and aorta dilation  Recent cardiac CTA ordered by primary care given strong family history of cardiac disease Detailing bicuspid aortic valve with mild calcification, dilated ascending aorta 4.4 - 4.5 cm No significant coronary calcification, no coronary atherosclerosis  Echocardiogram performed yesterday confirming normal LV function Mildly dilated ascending aorta 4.4 cm No significant regurgitation of aortic valve Mean gradient 8 mmHg, appears bicuspid  Active at baseline, no symptoms of shortness of breath or chest pain  Prior EKGs reviewed from September 2022, normal study at that time   PMH:   has a past medical history of Actinic keratosis, Atypical mole (09/15/2018), Basal cell carcinoma (10/20/2018), Bicuspid aortic valve determined by imaging, Closed T12 fracture (HCC) (2005), Contact lens/glasses fitting, GERD (gastroesophageal reflux disease), History of migraine, Hyperlipemia, Hypertension, Impingement syndrome of right shoulder (08/28/2012), Right rotator cuff tear (08/28/2012), and Thoracic aortic aneurysm (HCC).  PSH:    Past Surgical History:  Procedure Laterality Date   APPENDECTOMY  4/13   arh   COLONOSCOPY     x2   COLONOSCOPY WITH PROPOFOL N/A 03/23/2018   Procedure: COLONOSCOPY WITH PROPOFOL;  Surgeon: Midge Minium, MD;  Location: [facility];  Service: Endoscopy;   Laterality: N/A;   COLONOSCOPY WITH PROPOFOL N/A 05/09/2023   Procedure: COLONOSCOPY WITH PROPOFOL;  Surgeon: Midge Minium, MD;  Location: [facility];  Service: Endoscopy;  Laterality: N/A;   ESOPHAGOGASTRODUODENOSCOPY (EGD) WITH PROPOFOL N/A 03/23/2018   Procedure: ESOPHAGOGASTRODUODENOSCOPY (EGD) WITH PROPOFOL;  Surgeon: Midge Minium, MD;  Location: [facility];  Service: Endoscopy;  Laterality: N/A;   ESOPHAGOGASTRODUODENOSCOPY (EGD) WITH PROPOFOL N/A 05/09/2023   Procedure: ESOPHAGOGASTRODUODENOSCOPY (EGD) WITH PROPOFOL;  Surgeon: Midge Minium, MD;  Location: [facility];  Service: Endoscopy;  Laterality: N/A;   POLYPECTOMY N/A 03/23/2018   Procedure: POLYPECTOMY;  Surgeon: Midge Minium, MD;  Location: [facility];  Service: Endoscopy;  Laterality: N/A;   POLYPECTOMY  05/09/2023   Procedure: POLYPECTOMY;  Surgeon: Midge Minium, MD;  Location: [facility];  Service: Endoscopy;;   SHOULDER ARTHROSCOPY WITH ROTATOR CUFF REPAIR AND SUBACROMIAL DECOMPRESSION  08/28/2012   Procedure: SHOULDER ARTHROSCOPY WITH ROTATOR CUFF REPAIR AND SUBACROMIAL DECOMPRESSION;  Surgeon: Eulas Post, MD;  Location: [facility];  Service: Orthopedics;  Laterality: Right;  RIGHT SHOULDER DEBRIDEMENT LIMITED, ARTHROSCOPY SHJOULDER DECOMPRESSION SUBACROMIAL PARTIAL ACROMIOPLASTY WITH CORACOACROMIAL RELEASE, ARTHROSCOPY SHOULDER WITH ROTATOR CUFF REPAIR   UPPER GI ENDOSCOPY     x2    Current Outpatient Medications  Medication Sig Dispense Refill   aspirin EC 81 MG tablet Take 1 tablet (81 mg total) by mouth daily. 180 tablet 3   metoprolol succinate (TOPROL-XL) 25 MG 24 hr tablet Take one tablet by mouth daily 365 tablet 3   omeprazole (PRILOSEC) 40 MG capsule TAKE 1 TABLET BY MOUTH TWICE DAILY 180 capsule 3   Pitavastatin Calcium (LIVALO) 2 MG TABS Take 1 tablet by  mouth every day. 365 tablet 3   telmisartan (MICARDIS) 20 MG tablet Take 1 tablet (20 mg total) by  mouth daily. (Patient taking differently: Take 80 mg by mouth daily.) 120 tablet 2   COVID-19 At Home Antigen Test (CARESTART COVID-19 HOME TEST) KIT use as directed within package instructions (Patient not taking: Reported on 05/15/2023) 2 kit 0   meloxicam (MOBIC) 15 MG tablet Take 1 tablet (15 mg total) by mouth daily. (Patient not taking: Reported on 05/05/2023) 180 tablet 3   meloxicam (MOBIC) 15 MG tablet Take one tablet by mouth daily (Patient not taking: Reported on 05/05/2023) 180 tablet 3   Pitavastatin Calcium (LIVALO) 2 MG TABS Take 1 tablet (2 mg total) by mouth daily. (Patient not taking: Reported on 02/27/2023) 180 tablet 3   Pitavastatin Calcium (LIVALO) 2 MG TABS Take 1 tablet (2 mg total) by mouth daily. (Patient not taking: Reported on 05/15/2023) 365 tablet 3   No current facility-administered medications for this visit.     Allergies:   Patient has no known allergies.   Social History:  The patient  reports that he has never smoked. He has never used smokeless tobacco. He reports current alcohol use of about 4.0 standard drinks of alcohol per week. He reports that he does not use drugs.   Family History:   family history includes Coronary artery disease (age of onset: 25) in his father; Hyperlipidemia in his brother, mother, and sister; Hypertension in his brother.    Review of Systems: Review of Systems  Constitutional: Negative.   HENT: Negative.    Respiratory: Negative.    Cardiovascular: Negative.   Gastrointestinal: Negative.   Musculoskeletal: Negative.   Neurological: Negative.   Psychiatric/Behavioral: Negative.    All other systems reviewed and are negative.    PHYSICAL EXAM: VS:  BP 120/80 (BP Location: Left Arm, Patient Position: Sitting, Cuff Size: Normal)   Pulse 73   Ht 6' (1.829 m)   Wt 165 lb 4 oz (75 kg)   SpO2 98%   BMI 22.41 kg/m  , BMI Body mass index is 22.41 kg/m. GEN: Well nourished, well developed, in no acute distress HEENT:  normal Neck: no JVD, carotid bruits, or masses Cardiac: RRR; 1/6 systolic ejection murmur appreciated right sternal border murmurs, rubs, or gallops,no edema  Respiratory:  clear to auscultation bilaterally, normal work of breathing GI: soft, nontender, nondistended, + BS MS: no deformity or atrophy Skin: warm and dry, no rash Neuro:  Strength and sensation are intact Psych: euthymic mood, full affect    Recent Labs: 04/03/2023: BUN 18; Creatinine, Ser 0.96; Potassium 3.7; Sodium 139    Lipid Panel Lab Results  Component Value Date   CHOL 188 02/25/2022   HDL 58 02/25/2022   LDLCALC 105 02/25/2022   TRIG 140 02/25/2022      Wt Readings from Last 3 Encounters:  05/15/23 165 lb 4 oz (75 kg)  05/09/23 165 lb (74.8 kg)  12/08/19 175 lb 6.4 oz (79.6 kg)       ASSESSMENT AND PLAN:  Problem List Items Addressed This Visit       Cardiology Problems   Hypertension - Primary   Aortic aneurysm, intrathoracic (HCC)     Other   Palpitations   Dilated ascending aorta Estimated 4.4 up to 4.5 cm confirmed on CT scan and echocardiogram Blood pressure well-controlled No exercise restrictions or travel restrictions No further medication changes Consider repeat imaging with CT scan in 1 year Could consider alternating  with echocardiogram every other year  Bicuspid aortic valve Mild calcification, Low-grade murmur 1/6 appreciated, asymptomatic Periodic echocardiogram could be ordered especially if murmur becomes more intense  Hyperlipidemia Continue current therapy, Livalo LDL 100  Essential hypertension Continue metoprolol succinate 25 daily, telmisartan 40 mg daily up to 80 if needed New prescription sent in   Total encounter time more than 30 minutes  Greater than 50% was spent in counseling and coordination of care with the patient    Signed, Dossie Arbour, M.D., Ph.D. [facility] [place], [place] [phone]

## 2023-05-19 ENCOUNTER — Encounter: Payer: Self-pay | Admitting: Gastroenterology

## 2023-05-20 ENCOUNTER — Other Ambulatory Visit: Payer: Self-pay

## 2023-05-20 MED ORDER — DIPHENOXYLATE-ATROPINE 2.5-0.025 MG PO TABS
1.0000 | ORAL_TABLET | Freq: Four times a day (QID) | ORAL | 0 refills | Status: DC | PRN
Start: 2023-05-20 — End: 2024-01-14
  Filled 2023-05-20: qty 18, 3d supply, fill #0
  Filled 2023-05-20: qty 90, 12d supply, fill #0
  Filled 2023-05-20: qty 72, 9d supply, fill #0

## 2023-05-22 ENCOUNTER — Other Ambulatory Visit (HOSPITAL_COMMUNITY): Payer: Self-pay

## 2023-05-23 ENCOUNTER — Other Ambulatory Visit: Payer: Self-pay

## 2023-05-27 ENCOUNTER — Other Ambulatory Visit: Payer: Self-pay

## 2023-06-05 ENCOUNTER — Encounter: Payer: Self-pay | Admitting: Internal Medicine

## 2023-06-05 ENCOUNTER — Telehealth: Payer: Self-pay | Admitting: Internal Medicine

## 2023-06-05 NOTE — Telephone Encounter (Signed)
Dr. Darrick Huntsman are you aware of this?

## 2023-06-05 NOTE — Telephone Encounter (Signed)
Patient requesting imaging study of chest due to new onset hypertension and atypical chest pain. With a strong FH of of aortic aneurysm and CAD

## 2023-06-05 NOTE — Telephone Encounter (Signed)
Jose Boyer from [employer] called about a CT scan ordered in August 2024. It was denied and Jose Boyer is doing an appeal but there are no notes to back up her appeal

## 2023-06-13 NOTE — Telephone Encounter (Signed)
[employer] [employer] called again about a denial claim [phone]-[phone]

## 2023-06-13 NOTE — Telephone Encounter (Signed)
Spoke with Rosey Bath to let her know that there is not any documentation other than the telephone encounter for the appeal.

## 2023-06-23 ENCOUNTER — Other Ambulatory Visit: Payer: Self-pay

## 2023-06-23 MED ORDER — PITAVASTATIN CALCIUM 2 MG PO TABS
2.0000 mg | ORAL_TABLET | Freq: Every day | ORAL | 3 refills | Status: DC
  Filled 2023-06-23: qty 90, 90d supply, fill #0

## 2023-06-23 MED ORDER — OMEPRAZOLE 40 MG PO CPDR
40.0000 mg | DELAYED_RELEASE_CAPSULE | Freq: Two times a day (BID) | ORAL | 3 refills | Status: DC
  Filled 2023-06-23: qty 180, 90d supply, fill #0

## 2023-06-23 MED ORDER — METOPROLOL SUCCINATE ER 25 MG PO TB24
25.0000 mg | ORAL_TABLET | Freq: Every day | ORAL | 3 refills | Status: DC
  Filled 2023-06-23: qty 90, 90d supply, fill #0

## 2023-08-11 ENCOUNTER — Other Ambulatory Visit: Payer: Self-pay | Admitting: Internal Medicine

## 2023-08-11 ENCOUNTER — Other Ambulatory Visit: Payer: Self-pay

## 2023-08-11 DIAGNOSIS — I10 Essential (primary) hypertension: Secondary | ICD-10-CM

## 2023-08-11 MED ORDER — AMLODIPINE BESYLATE 5 MG PO TABS
5.0000 mg | ORAL_TABLET | Freq: Every day | ORAL | 1 refills | Status: DC
  Filled 2023-08-11: qty 90, 90d supply, fill #0

## 2023-08-11 MED ORDER — ESCITALOPRAM OXALATE 10 MG PO TABS
10.0000 mg | ORAL_TABLET | Freq: Every day | ORAL | 2 refills | Status: DC
  Filled 2023-08-11: qty 30, 30d supply, fill #0

## 2023-08-11 NOTE — Assessment & Plan Note (Signed)
Stopping telmisartan 80 mg due to general malaise inclduding persistent sinus headache and irritability.  Trial of  5 mg amloipine  .  Continue 25 mg toprol XL .  Goal BP 120/70

## 2023-08-14 ENCOUNTER — Ambulatory Visit: Payer: 59 | Admitting: Dermatology

## 2023-08-21 ENCOUNTER — Ambulatory Visit: Payer: 59 | Admitting: Dermatology

## 2023-08-21 DIAGNOSIS — L821 Other seborrheic keratosis: Secondary | ICD-10-CM | POA: Diagnosis not present

## 2023-08-21 DIAGNOSIS — L578 Other skin changes due to chronic exposure to nonionizing radiation: Secondary | ICD-10-CM | POA: Diagnosis not present

## 2023-08-21 DIAGNOSIS — L729 Follicular cyst of the skin and subcutaneous tissue, unspecified: Secondary | ICD-10-CM

## 2023-08-21 DIAGNOSIS — L814 Other melanin hyperpigmentation: Secondary | ICD-10-CM

## 2023-08-21 DIAGNOSIS — L738 Other specified follicular disorders: Secondary | ICD-10-CM | POA: Diagnosis not present

## 2023-08-21 DIAGNOSIS — L820 Inflamed seborrheic keratosis: Secondary | ICD-10-CM | POA: Diagnosis not present

## 2023-08-21 DIAGNOSIS — D1801 Hemangioma of skin and subcutaneous tissue: Secondary | ICD-10-CM | POA: Diagnosis not present

## 2023-08-21 DIAGNOSIS — Z86018 Personal history of other benign neoplasm: Secondary | ICD-10-CM

## 2023-08-21 DIAGNOSIS — Z85828 Personal history of other malignant neoplasm of skin: Secondary | ICD-10-CM

## 2023-08-21 DIAGNOSIS — L570 Actinic keratosis: Secondary | ICD-10-CM

## 2023-08-21 DIAGNOSIS — W908XXA Exposure to other nonionizing radiation, initial encounter: Secondary | ICD-10-CM | POA: Diagnosis not present

## 2023-08-21 DIAGNOSIS — L720 Epidermal cyst: Secondary | ICD-10-CM

## 2023-08-21 DIAGNOSIS — Z1283 Encounter for screening for malignant neoplasm of skin: Secondary | ICD-10-CM

## 2023-08-21 DIAGNOSIS — D229 Melanocytic nevi, unspecified: Secondary | ICD-10-CM

## 2023-08-21 NOTE — Progress Notes (Signed)
Follow-Up Visit   Subjective  Jose Boyer is a 57 y.o. male who presents for the following: Skin Cancer Screening and Full Body Skin Exam  The patient presents for Total-Body Skin Exam (TBSE) for skin cancer screening and mole check. The patient has spots, moles and lesions to be evaluated, some may be new or changing and the patient may have concern these could be cancer.  The following portions of the chart were reviewed this encounter and updated as appropriate: medications, allergies, medical history  Review of Systems:  No other skin or systemic complaints except as noted in HPI or Assessment and Plan.  Objective  Well appearing patient in no apparent distress; mood and affect are within normal limits.  A full examination was performed including scalp, head, eyes, ears, nose, lips, neck, chest, axillae, abdomen, back, buttocks, bilateral upper extremities, bilateral lower extremities, hands, feet, fingers, toes, fingernails, and toenails. All findings within normal limits unless otherwise noted below.   Relevant physical exam findings are noted in the Assessment and Plan.  scalp sup to forehead L of midlne x 1, L upper eyelid x 1, genital x1 trunkx 15 (18) Erythematous stuck-on, waxy papule or plaqueStuck on waxy paps with erythema L cheek zygoma x 1 Erythematous thin papules/macules with gritty scale.   Assessment & Plan   SKIN CANCER SCREENING PERFORMED TODAY.  ACTINIC DAMAGE - Chronic condition, secondary to cumulative UV/sun exposure - diffuse scaly erythematous macules with underlying dyspigmentation - Recommend daily broad spectrum sunscreen SPF 30+ to sun-exposed areas, reapply every 2 hours as needed.  - Staying in the shade or wearing long sleeves, sun glasses (UVA+UVB protection) and wide brim hats (4-inch brim around the entire circumference of the hat) are also recommended for sun protection.  - Call for new or changing lesions.  LENTIGINES, SEBORRHEIC  KERATOSES, HEMANGIOMAS - Benign normal skin lesions - Benign-appearing - Call for any changes  MELANOCYTIC NEVI - Tan-brown and/or pink-flesh-colored symmetric macules and papules - Benign appearing on exam today - Observation - Call clinic for new or changing moles - Recommend daily use of broad spectrum spf 30+ sunscreen to sun-exposed areas.   HISTORY OF BASAL CELL CARCINOMA OF THE SKIN - No evidence of recurrence today - Recommend regular full body skin exams - Recommend daily broad spectrum sunscreen SPF 30+ to sun-exposed areas, reapply every 2 hours as needed.  - Call if any new or changing lesions are noted between office visits  HISTORY OF DYSPLASTIC NEVUS No evidence of recurrence today Recommend regular full body skin exams Recommend daily broad spectrum sunscreen SPF 30+ to sun-exposed areas, reapply every 2 hours as needed.  Call if any new or changing lesions are noted between office visits INFLAMED SEBORRHEIC KERATOSIS (18) scalp sup to forehead L of midlne x 1, L upper eyelid x 1, genital x1 trunkx 15 (18) Symptomatic, irritating, patient would like treated. (Advised all appear to be benign seborrheic Keratoses and he has had hundreds treated in the past and there is strong family history of seborrheic keratoses.  Nevertheless, advised that if any persist or recur, there is a possibility of element of HPV in these - he understands) Destruction of lesion - scalp sup to forehead L of midlne x 1, L upper eyelid x 1, genital x1 trunkx 15 (18) Complexity: simple   Destruction method: cryotherapy   Informed consent: discussed and consent obtained   Timeout:  patient name, date of birth, surgical site, and procedure verified Lesion destroyed using  liquid nitrogen: Yes   Region frozen until ice ball extended beyond lesion: Yes   Outcome: patient tolerated procedure well with no complications   Post-procedure details: wound care instructions given   AK (ACTINIC KERATOSIS) L  cheek zygoma x 1 Actinic keratoses are precancerous spots that appear secondary to cumulative UV radiation exposure/sun exposure over time. They are chronic with expected duration over 1 year. A portion of actinic keratoses will progress to squamous cell carcinoma of the skin. It is not possible to reliably predict which spots will progress to skin cancer and so treatment is recommended to prevent development of skin cancer.  Recommend daily broad spectrum sunscreen SPF 30+ to sun-exposed areas, reapply every 2 hours as needed.  Recommend staying in the shade or wearing long sleeves, sun glasses (UVA+UVB protection) and wide brim hats (4-inch brim around the entire circumference of the hat). Call for new or changing lesions.  Destruction of lesion - L cheek zygoma x 1 Complexity: simple   Destruction method: cryotherapy   Informed consent: discussed and consent obtained   Timeout:  patient name, date of birth, surgical site, and procedure verified Lesion destroyed using liquid nitrogen: Yes   Region frozen until ice ball extended beyond lesion: Yes   Outcome: patient tolerated procedure well with no complications   Post-procedure details: wound care instructions given    Sebaceous Hyperplasia - Small yellow papules with a central dell - Benign-appearing - Observe. Call for changes.  Milia - tiny firm white papules - type of cyst - benign - sometimes these will clear with nightly OTC adapalene/Differin 0.1% gel or retinol. - may be extracted if symptomatic - observe  Return in about 1 year (around 08/20/2024) for TBSE.  Maylene Roes, CMA, am acting as scribe for Armida Sans, MD .  Documentation: I have reviewed the above documentation for accuracy and completeness, and I agree with the above.  Armida Sans, MD

## 2023-08-21 NOTE — Patient Instructions (Signed)

## 2023-08-26 ENCOUNTER — Encounter: Payer: Self-pay | Admitting: Dermatology

## 2023-09-15 ENCOUNTER — Other Ambulatory Visit: Payer: Self-pay

## 2023-09-15 ENCOUNTER — Other Ambulatory Visit: Payer: Self-pay | Admitting: Internal Medicine

## 2023-09-15 MED ORDER — OMEPRAZOLE 40 MG PO CPDR
40.0000 mg | DELAYED_RELEASE_CAPSULE | Freq: Two times a day (BID) | ORAL | 3 refills | Status: DC
  Filled 2023-09-15: qty 180, 90d supply, fill #0

## 2023-09-15 MED ORDER — MELOXICAM 15 MG PO TABS
15.0000 mg | ORAL_TABLET | Freq: Every day | ORAL | 3 refills | Status: DC
  Filled 2023-09-15: qty 90, 90d supply, fill #0

## 2023-09-15 MED ORDER — ESCITALOPRAM OXALATE 10 MG PO TABS
10.0000 mg | ORAL_TABLET | Freq: Every day | ORAL | 1 refills | Status: DC
  Filled 2023-09-15: qty 90, 90d supply, fill #0

## 2023-09-15 MED ORDER — METOPROLOL SUCCINATE ER 25 MG PO TB24
25.0000 mg | ORAL_TABLET | Freq: Every day | ORAL | 3 refills | Status: DC
  Filled 2023-09-15: qty 90, 90d supply, fill #0

## 2023-09-15 MED ORDER — PITAVASTATIN CALCIUM 2 MG PO TABS
2.0000 mg | ORAL_TABLET | Freq: Every day | ORAL | 3 refills | Status: DC
  Filled 2023-09-15: qty 90, 90d supply, fill #0

## 2023-11-21 DIAGNOSIS — H903 Sensorineural hearing loss, bilateral: Secondary | ICD-10-CM | POA: Diagnosis not present

## 2023-11-24 ENCOUNTER — Other Ambulatory Visit: Payer: Self-pay | Admitting: Internal Medicine

## 2023-11-24 DIAGNOSIS — I712 Thoracic aortic aneurysm, without rupture, unspecified: Secondary | ICD-10-CM

## 2023-11-26 ENCOUNTER — Telehealth: Payer: Self-pay | Admitting: Internal Medicine

## 2023-11-26 NOTE — Telephone Encounter (Signed)
 Lft pt vm to call ofc to sch CT. thanks

## 2023-11-27 ENCOUNTER — Telehealth: Payer: Self-pay | Admitting: Internal Medicine

## 2023-11-27 NOTE — Telephone Encounter (Signed)
 Lft pt vm to call ofc to sch CT. thanks

## 2023-11-28 ENCOUNTER — Telehealth: Payer: Self-pay | Admitting: Internal Medicine

## 2023-11-28 NOTE — Telephone Encounter (Signed)
 Lft pt vm to call ofc to sch CT. thanks

## 2023-12-01 ENCOUNTER — Other Ambulatory Visit: Payer: Self-pay | Admitting: Internal Medicine

## 2023-12-01 DIAGNOSIS — I712 Thoracic aortic aneurysm, without rupture, unspecified: Secondary | ICD-10-CM

## 2023-12-04 ENCOUNTER — Ambulatory Visit
Admission: RE | Admit: 2023-12-04 | Discharge: 2023-12-04 | Disposition: A | Discharge status: Home / Self Care | Source: Ambulatory Visit | Attending: Internal Medicine | Admitting: Internal Medicine

## 2023-12-04 DIAGNOSIS — R911 Solitary pulmonary nodule: Secondary | ICD-10-CM | POA: Diagnosis not present

## 2023-12-04 DIAGNOSIS — I712 Thoracic aortic aneurysm, without rupture, unspecified: Secondary | ICD-10-CM

## 2023-12-04 DIAGNOSIS — I7121 Aneurysm of the ascending aorta, without rupture: Secondary | ICD-10-CM | POA: Diagnosis not present

## 2023-12-04 MED ORDER — IOPAMIDOL (ISOVUE-370) INJECTION 76%
75.0000 mL | Freq: Once | INTRAVENOUS | Status: AC | PRN
  Administered 2023-12-04: 75 mL via INTRAVENOUS

## 2023-12-11 ENCOUNTER — Encounter: Payer: Self-pay | Admitting: Internal Medicine

## 2023-12-11 ENCOUNTER — Other Ambulatory Visit: Payer: Self-pay | Admitting: Internal Medicine

## 2023-12-11 ENCOUNTER — Other Ambulatory Visit: Payer: Self-pay

## 2023-12-11 DIAGNOSIS — I77810 Thoracic aortic ectasia: Secondary | ICD-10-CM | POA: Insufficient documentation

## 2023-12-11 DIAGNOSIS — I712 Thoracic aortic aneurysm, without rupture, unspecified: Secondary | ICD-10-CM

## 2023-12-11 DIAGNOSIS — Q2381 Bicuspid aortic valve: Secondary | ICD-10-CM | POA: Insufficient documentation

## 2023-12-11 MED ORDER — ESCITALOPRAM OXALATE 10 MG PO TABS
10.0000 mg | ORAL_TABLET | Freq: Every day | ORAL | 0 refills | Status: DC
  Filled 2023-12-11: qty 90, 90d supply, fill #0

## 2023-12-18 DIAGNOSIS — H903 Sensorineural hearing loss, bilateral: Secondary | ICD-10-CM | POA: Diagnosis not present

## 2023-12-19 ENCOUNTER — Other Ambulatory Visit: Payer: Self-pay

## 2023-12-19 MED ORDER — METOPROLOL SUCCINATE ER 25 MG PO TB24
25.0000 mg | ORAL_TABLET | Freq: Every day | ORAL | 3 refills | Status: AC
  Filled 2023-12-19: qty 90, 90d supply, fill #0

## 2023-12-19 MED ORDER — PITAVASTATIN CALCIUM 2 MG PO TABS
2.0000 mg | ORAL_TABLET | Freq: Every day | ORAL | 3 refills | Status: AC
  Filled 2023-12-19: qty 90, 90d supply, fill #0

## 2023-12-19 MED ORDER — OMEPRAZOLE 40 MG PO CPDR
40.0000 mg | DELAYED_RELEASE_CAPSULE | Freq: Two times a day (BID) | ORAL | 3 refills | Status: AC
  Filled 2023-12-19: qty 180, 90d supply, fill #0
  Filled 2024-04-13: qty 180, 90d supply, fill #1

## 2023-12-19 MED ORDER — MELOXICAM 15 MG PO TABS
15.0000 mg | ORAL_TABLET | Freq: Every day | ORAL | 3 refills | Status: AC
  Filled 2023-12-19: qty 90, 90d supply, fill #0

## 2024-01-14 ENCOUNTER — Ambulatory Visit: Attending: Surgery | Admitting: Surgery

## 2024-01-14 VITALS — BP 147/97 | HR 71 | Resp 18 | Ht 72.0 in | Wt 169.0 lb

## 2024-01-14 DIAGNOSIS — I77810 Thoracic aortic ectasia: Secondary | ICD-10-CM

## 2024-01-14 DIAGNOSIS — I712 Thoracic aortic aneurysm, without rupture, unspecified: Secondary | ICD-10-CM

## 2024-01-14 NOTE — Progress Notes (Signed)
 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 Creta Dolin and by Dr. Juanda Noon.  Patient had a gated cardiac CT performed on 04/30/2023 which showed an aortic root and ascending aortic aneurysm that was measured at 4.5 cm.  He said this was done due to some pains in his back that he thinks were musculoskeletal.  The aortic valve was bicuspid with mild calcification.  The coronary calcium  score was 0.  A 2D echocardiogram on 05/14/2023 showed a bicuspid aortic valve with some calcification present without stenosis or insufficiency.  The mean gradient was 8 mmHg.  The aortic root was measured at 4.0 cm and the ascending aorta at 4.4 cm.  Left ventricular ejection fraction was 55 to 60%.  He subsequently had a CTA of the chest performed on 12/04/2023 which was read as showing the aortic root measured at 4.8 cm and the ascending aortic aneurysm at 4.5 cm.  There was a 4 mm right lower lobe nodule.  He is here today with his daughter who is getting ready to start her emergency medicine residency at [employer].  He is very physically active and gets to the gym several days per week.  There is no family history of bicuspid aortic valve disease, aortic aneurysm, aortic dissection, or connective tissue disorder.  His father did have coronary disease at a young age and underwent coronary bypass surgery, cardiac transplant, kidney transplant due to failure from cyclosporine, and eventually died in his 31s.  Past Medical History:  Diagnosis Date   Actinic keratosis    Atypical mole 09/15/2018   L inferior buttock  inf to the post waistline    Basal cell  carcinoma 10/20/2018   Right sideburn sup preauricular    Bicuspid aortic valve determined by imaging    Closed T12 fracture (HCC) 2005   Contact lens/glasses fitting    wears contacts or glasses   GERD (gastroesophageal reflux disease)    History of migraine    Hyperlipemia    Hypertension    Impingement syndrome of right shoulder 08/28/2012   Right rotator cuff tear 08/28/2012   Thoracic aortic aneurysm [facility])     Past Surgical History:  Procedure Laterality Date   APPENDECTOMY  4/13   arh   COLONOSCOPY     x2   COLONOSCOPY WITH PROPOFOL  N/A 03/23/2018   Procedure: COLONOSCOPY WITH PROPOFOL ;  Surgeon: Marnee Sink, MD;  Location: [facility];  Service: Endoscopy;  Laterality: N/A;   COLONOSCOPY WITH PROPOFOL  N/A 05/09/2023   Procedure: COLONOSCOPY WITH PROPOFOL ;  Surgeon: Marnee Sink, MD;  Location: [facility];  Service: Endoscopy;  Laterality: N/A;   ESOPHAGOGASTRODUODENOSCOPY (EGD) WITH PROPOFOL  N/A 03/23/2018   Procedure: ESOPHAGOGASTRODUODENOSCOPY (EGD) WITH PROPOFOL ;  Surgeon: Marnee Sink, MD;  Location: [facility];  Service: Endoscopy;  Laterality: N/A;   ESOPHAGOGASTRODUODENOSCOPY (EGD) WITH PROPOFOL  N/A 05/09/2023   Procedure: ESOPHAGOGASTRODUODENOSCOPY (EGD) WITH PROPOFOL ;  Surgeon: Marnee Sink, MD;  Location: [facility];  Service: Endoscopy;  Laterality: N/A;   POLYPECTOMY N/A 03/23/2018   Procedure: POLYPECTOMY;  Surgeon: Marnee Sink, MD;  Location: [facility];  Service: Endoscopy;  Laterality: N/A;   POLYPECTOMY  05/09/2023   Procedure: POLYPECTOMY;  Surgeon: Marnee Sink, MD;  Location: [facility];  Service: Endoscopy;;   SHOULDER ARTHROSCOPY WITH ROTATOR CUFF REPAIR AND SUBACROMIAL DECOMPRESSION  08/28/2012   Procedure: SHOULDER ARTHROSCOPY WITH ROTATOR CUFF REPAIR AND SUBACROMIAL DECOMPRESSION;  Surgeon: Neville Barbone, MD;  Location: [facility];  Service: Orthopedics;  Laterality: Right;  RIGHT  SHOULDER DEBRIDEMENT LIMITED, ARTHROSCOPY SHJOULDER DECOMPRESSION SUBACROMIAL PARTIAL ACROMIOPLASTY WITH CORACOACROMIAL RELEASE, ARTHROSCOPY SHOULDER WITH ROTATOR CUFF REPAIR   UPPER GI ENDOSCOPY     x2    Family History  Problem Relation Age of Onset   Hyperlipidemia Mother    Coronary artery disease Father 81       died at age 72   Hyperlipidemia Sister    Hyperlipidemia Brother    Hypertension Brother     Social History Social History   Tobacco Use   Smoking status: Never   Smokeless tobacco: Never  Vaping Use   Vaping status: Never Used  Substance Use Topics   Alcohol use: Yes    Alcohol/week: 4.0 standard drinks of alcohol    Types: 4 Shots of liquor per week    Comment: occ   Drug use: No    Current Outpatient Medications  Medication Sig Dispense Refill   aspirin  EC 81 MG tablet Take 1 tablet (81 mg total) by mouth daily. 180 tablet 3   AZILSARTAN MEDOXOMIL PO Take 20 mg by mouth daily. Every morning     escitalopram  (LEXAPRO ) 10 MG tablet Take 1 tablet (10 mg total) by mouth daily. 180 tablet 0   meloxicam  (MOBIC ) 15 MG tablet Take 1 tablet (15 mg total) by mouth daily. 180 tablet 3   metoprolol  succinate (TOPROL -XL) 25 MG 24 hr tablet Take one tablet by mouth daily 365 tablet 3   omeprazole  (PRILOSEC) 40 MG capsule TAKE 1 TABLET BY MOUTH TWICE DAILY 180 capsule 3   omeprazole  (PRILOSEC) 40 MG capsule Take 1 capsule (40 mg total) by mouth 2 (two) times daily. 180 capsule 3   Pitavastatin  Calcium  (LIVALO ) 2 MG TABS Take 1 tablet (2 mg total) by mouth daily. 365 tablet 3   amLODipine  (NORVASC ) 5 MG tablet Take 1 tablet (5 mg total) by mouth daily. (Patient not taking: Reported on 01/14/2024) 90 tablet 1   No current facility-administered medications for this visit.    No Known Allergies  Review of Systems  Constitutional:  Negative for activity change and fatigue.  Respiratory:  Negative for shortness of breath.   Cardiovascular:  Negative for chest pain.   Neurological:  Negative for dizziness and syncope.    BP (!) 147/97   Pulse 71   Resp 18   Ht 6' (1.829 m)   Wt 169 lb (76.7 kg)   SpO2 100%   BMI 22.92 kg/m  Physical Exam Constitutional:      Appearance: Normal appearance. He is normal weight.  Cardiovascular:     Rate and Rhythm: Normal rate and regular rhythm.     Heart sounds: Murmur heard.     Comments: 1/6 systolic murmur along the right lower sternal border.  There is no diastolic murmur. Pulmonary:     Effort: Pulmonary effort is normal.     Breath sounds: Normal breath sounds.  Neurological:     Mental Status: He is alert.      Diagnostic Tests:  ECHOCARDIOGRAM REPORT       Patient Name:   DR. Johnell Na T  Huckins Date of Exam: 05/14/2023  Medical Rec #:  [id number]             Height:       72.0 in  Accession #:    [id number]            Weight:       165.0 lb  Date of Birth:  [birth year]             BSA:          1.963 m  Patient Age:    57 years              BP:           143/84 mmHg  Patient Gender: M                     HR:           79 bpm.  Exam Location:  [address]   Procedure: 2D Echo, 3D Echo, Cardiac Doppler, Color Doppler and Strain  Analysis   Indications:    I35.9 Aortic Valve disorder    History:         Patient has no prior history of Echocardiogram  examinations.                  Aortic aneurysm, Signs/Symptoms:Chest Pain and  Palpitations;                  Risk Factors:Hypertension.    Sonographer:     Juventino Oppenheim RCS  Referring Phys:  [id number] Devorah Fonder  Diagnosing Phys: Dinah Franco MD   IMPRESSIONS     1. Left ventricular ejection fraction, by estimation, is 55 to 60%. Left  ventricular ejection fraction by 3D volume is 58 %. The left ventricle has  normal function. The left ventricle has no regional wall motion  abnormalities. Left ventricular diastolic   parameters are consistent with Grade I diastolic dysfunction (impaired  relaxation). The average left ventricular  global longitudinal strain is  -16.6 %. The global longitudinal strain is abnormal.   2. Right ventricular systolic function is normal. The right ventricular  size is normal. There is normal pulmonary artery systolic pressure. The  estimated right ventricular systolic pressure is 11.2 mmHg.   3. The mitral valve is grossly normal. No evidence of mitral valve  regurgitation.   4. The aortic valve is bicuspid. Aortic valve regurgitation is not  visualized. Aortic valve sclerosis/calcification is present, without any  evidence of aortic stenosis. Aortic valve mean gradient measures 8.0 mmHg.   5. Aortic root is calcified. Aortic dilatation noted. There is mild  dilatation of the aortic root, measuring 40 mm. There is mild dilatation  of the ascending aorta, measuring 44 mm.   6. The inferior vena cava is normal in size with greater than 50%  respiratory variability, suggesting right atrial pressure of 3 mmHg.   Comparison(s): No prior Echocardiogram.   FINDINGS   Left Ventricle: Left ventricular ejection fraction, by estimation, is 55  to 60%. Left ventricular ejection fraction by 3D volume is 58 %. The left  ventricle has normal function. The left ventricle has no regional wall  motion abnormalities. The average  left ventricular global longitudinal strain is -16.6 %. The global  longitudinal strain is abnormal. The left ventricular internal cavity size  was normal in size. There is no left ventricular hypertrophy. Left  ventricular diastolic parameters are  consistent with Grade I diastolic  dysfunction (impaired relaxation).  Indeterminate filling pressures.   Right Ventricle: The right ventricular size is normal. No increase in  right ventricular wall thickness. Right ventricular systolic function is  normal. There is normal pulmonary artery systolic pressure. The tricuspid  regurgitant velocity is 1.43 m/s, and   with an assumed right atrial pressure of 3 mmHg, the estimated right   ventricular systolic pressure is 11.2 mmHg.   Left Atrium: Left atrial size was normal in size.   Right Atrium: Right atrial size was normal in size.   Pericardium: There is no evidence of pericardial effusion.   Mitral Valve: The mitral valve is grossly normal. No evidence of mitral  valve regurgitation.   Tricuspid Valve: The tricuspid valve is grossly normal. Tricuspid valve  regurgitation is trivial.   Aortic Valve: The aortic valve is bicuspid. Aortic valve regurgitation is  not visualized. Aortic valve sclerosis/calcification is present, without  any evidence of aortic stenosis. Aortic valve mean gradient measures 8.0  mmHg. Aortic valve peak gradient  measures 12.4 mmHg. Aortic valve area, by VTI measures 2.41 cm.   Pulmonic Valve: The pulmonic valve was normal in structure. Pulmonic valve  regurgitation is not visualized.   Aorta: Aortic root is calcified. Aortic dilatation noted. There is mild  dilatation of the aortic root, measuring 40 mm. There is mild dilatation  of the ascending aorta, measuring 44 mm.   Venous: The inferior vena cava is normal in size with greater than 50%  respiratory variability, suggesting right atrial pressure of 3 mmHg.   IAS/Shunts: No atrial level shunt detected by color flow Doppler.     LEFT VENTRICLE  PLAX 2D  LVIDd:         4.00 cm         Diastology  LVIDs:         2.80 cm         LV e' medial:    7.83 cm/s  LV PW:         1.00 cm         LV E/e' medial:  8.1  LV IVS:        1.00 cm         LV e' lateral:   16.60 cm/s  LVOT diam:     2.70 cm         LV E/e' lateral: 3.8  LV SV:         94  LV SV Index:   48              2D  LVOT Area:     5.73 cm        Longitudinal                                 Strain                                 2D Strain GLS  -18.8 %                                 (A2C):                                 2D Strain GLS  -14.6 %                                 (  A3C):                                  2D Strain GLS  -16.5 %                                 (A4C):                                 2D Strain GLS  -16.6 %                                 Avg:                                   3D Volume EF                                 LV 3D EF:    Left                                              ventricul                                              ar                                              ejection                                              fraction                                              by 3D                                              volume is                                              58 %.                                   3D Volume EF:  3D EF:        58 %                                 LV EDV:       111 ml                                 LV ESV:       46 ml                                 LV SV:        65 ml   RIGHT VENTRICLE  RV Basal diam:  2.70 cm  RV S prime:     13.50 cm/s  TAPSE (M-mode): 2.0 cm  RVSP:           11.2 mmHg   LEFT ATRIUM             Index        RIGHT ATRIUM           Index  LA diam:        2.30 cm 1.17 cm/m   RA Pressure: 3.00 mmHg  LA Vol (A2C):   44.9 ml 22.87 ml/m  RA Area:     12.00 cm  LA Vol (A4C):   26.8 ml 13.65 ml/m  RA Volume:   25.60 ml  13.04 ml/m  LA Biplane Vol: 35.9 ml 18.29 ml/m   AORTIC VALVE  AV Area (Vmax):    2.14 cm  AV Area (Vmean):   2.21 cm  AV Area (VTI):     2.41 cm  AV Vmax:           176.00 cm/s  AV Vmean:          133.000 cm/s  AV VTI:            0.391 m  AV Peak Grad:      12.4 mmHg  AV Mean Grad:      8.0 mmHg  LVOT Vmax:         65.90 cm/s  LVOT Vmean:        51.450 cm/s  LVOT VTI:          0.165 m  LVOT/AV VTI ratio: 0.42    AORTA  Ao Root diam: 4.00 cm  Ao Asc diam:  4.40 cm   MITRAL VALVE               TRICUSPID VALVE  MV Area (PHT):             TR Peak grad:   8.2 mmHg  MV Decel Time:             TR Vmax:        143.00 cm/s  MV E velocity:  63.40 cm/s  Estimated RAP:  3.00 mmHg  MV A velocity: 72.80 cm/s  RVSP:           11.2 mmHg  MV E/A ratio:  0.87                             SHUNTS  Systemic VTI:  0.16 m                             Systemic Diam: 2.70 cm   Dinah Franco MD  Electronically signed by Dinah Franco MD  Signature Date/Time: 05/14/2023/10:46:04 PM        Final (Updated)     Narrative & Impression  CLINICAL DATA:  Thoracic aortic aneurysm.   EXAM: CT ANGIOGRAPHY CHEST WITH CONTRAST   TECHNIQUE: Multidetector CT imaging of the chest was performed using the standard protocol during bolus administration of intravenous contrast. Multiplanar CT image reconstructions and MIPs were obtained to evaluate the vascular anatomy.   RADIATION DOSE REDUCTION: This exam was performed according to the departmental dose-optimization program which includes automated exposure control, adjustment of the mA and/or kV according to patient size and/or use of iterative reconstruction technique.   CONTRAST:  75mL ISOVUE -370 IOPAMIDOL  (ISOVUE -370) INJECTION 76%   COMPARISON:  April 30, 2023.   FINDINGS: Cardiovascular: Stable 4.5 cm ascending thoracic aortic aneurysm. Aortic root is dilated at 4.8 cm. Great vessels are widely patent. No dissection is noted. Normal cardiac size. No pericardial effusion.   Mediastinum/Nodes: No enlarged mediastinal, hilar, or axillary lymph nodes. Thyroid  gland, trachea, and esophagus demonstrate no significant findings.   Lungs/Pleura: No pneumothorax or pleural effusion is noted. Stable 4 mm nodule seen in right lower lobe posteriorly best seen on image number 94 of series 5. No consolidative process is noted.   Upper Abdomen: No acute abnormality.   Musculoskeletal: No chest wall abnormality. No acute or significant osseous findings.   Review of the MIP images confirms the above findings.   IMPRESSION: Grossly stable 4.5 cm Ascending thoracic  aortic aneurysm. Recommend semi-annual imaging followup by CTA or MRA and referral to cardiothoracic surgery if not already obtained. This recommendation follows 2010 ACCF/AHA/AATS/ACR/ASA/SCA/SCAI/SIR/STS/SVM Guidelines for the Diagnosis and Management of Patients With Thoracic Aortic Disease. Circulation. 2010; 121: Z610-R604. Aortic aneurysm NOS (ICD10-I71.9).   Aortic root is dilated at 4.8 cm.   Stable 4 mm right lower lobe nodule. Attention to this on follow-up imaging is recommended.     Electronically Signed   By: Rosalene Colon M.D.   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  I do not think this has significantly changed compared to his gated cardiac CTA in August 2024.  His ascending aorta is currently measured at 4.5 cm and is unchanged compared to his gated cardiac CT in August 2024.  The aorta returns to normal just before the innominate artery.  His aneurysm is below the usual surgical threshold of 5.0 cm in a patient with a bicuspid aortic valve.  I reviewed the CT images and echo images with the patient and his daughter and answered their questions.  I recommended doing a repeat CT of the chest in 6 months.  I stressed the importance of continued good blood pressure control in preventing further enlargement and acute aortic dissection.  I advised him against doing any heavy lifting that may require a Valsalva maneuver and could suddenly raise his blood pressure to high levels.  I think any kind of aerobic activity is fine.  I also advised him against discontinuing any of his antihypertensive medications.  Plan:  I will see him back in 6 months after he  has a repeat CTA of the chest.  He is going to have Dr. Madelon Scheuermann ordered that in [place].  I spent 20 minutes performing this consultation and > 50% of this time was spent  face to face counseling and coordinating the care of this patient's bicuspid aortic valve and aortic root/ascending aortic aneurysm.  Bartley Lightning, MD [facility] [phone]

## 2024-02-19 ENCOUNTER — Ambulatory Visit: Payer: 59 | Admitting: Dermatology

## 2024-02-26 ENCOUNTER — Other Ambulatory Visit: Payer: Self-pay

## 2024-02-26 MED ORDER — EDARBI 40 MG PO TABS
1.0000 | ORAL_TABLET | Freq: Every day | ORAL | 3 refills | Status: AC
Start: 2024-02-26 — End: ?
  Filled 2024-02-26 – 2024-03-11 (×4): qty 90, 90d supply, fill #0

## 2024-03-11 ENCOUNTER — Telehealth: Payer: Self-pay

## 2024-03-11 ENCOUNTER — Other Ambulatory Visit: Payer: Self-pay | Admitting: Internal Medicine

## 2024-03-11 ENCOUNTER — Other Ambulatory Visit: Payer: Self-pay

## 2024-03-11 DIAGNOSIS — E785 Hyperlipidemia, unspecified: Secondary | ICD-10-CM

## 2024-03-11 DIAGNOSIS — I10 Essential (primary) hypertension: Secondary | ICD-10-CM

## 2024-03-11 DIAGNOSIS — Z125 Encounter for screening for malignant neoplasm of prostate: Secondary | ICD-10-CM

## 2024-03-11 DIAGNOSIS — R002 Palpitations: Secondary | ICD-10-CM

## 2024-03-11 MED ORDER — ESCITALOPRAM OXALATE 10 MG PO TABS
10.0000 mg | ORAL_TABLET | Freq: Every day | ORAL | 3 refills | Status: DC
  Filled 2024-03-11: qty 90, 90d supply, fill #0

## 2024-03-11 NOTE — Telephone Encounter (Signed)
 LMTCB. Need to schedule pt a fasting lab appt when he returns the call. Labs are ordered.

## 2024-03-12 ENCOUNTER — Other Ambulatory Visit: Payer: Self-pay

## 2024-03-15 ENCOUNTER — Other Ambulatory Visit: Payer: Self-pay

## 2024-03-15 ENCOUNTER — Other Ambulatory Visit (INDEPENDENT_AMBULATORY_CARE_PROVIDER_SITE_OTHER)

## 2024-03-15 DIAGNOSIS — Z125 Encounter for screening for malignant neoplasm of prostate: Secondary | ICD-10-CM

## 2024-03-15 DIAGNOSIS — I10 Essential (primary) hypertension: Secondary | ICD-10-CM

## 2024-03-15 DIAGNOSIS — R002 Palpitations: Secondary | ICD-10-CM

## 2024-03-15 LAB — CBC WITH DIFFERENTIAL/PLATELET
Basophils Absolute: 0 K/uL (ref 0.0–0.1)
Basophils Relative: 0.5 % (ref 0.0–3.0)
Eosinophils Absolute: 0.1 K/uL (ref 0.0–0.7)
Eosinophils Relative: 3 % (ref 0.0–5.0)
HCT: 40.1 % (ref 39.0–52.0)
Hemoglobin: 13.5 g/dL (ref 13.0–17.0)
Lymphocytes Relative: 34.6 % (ref 12.0–46.0)
Lymphs Abs: 1.3 K/uL (ref 0.7–4.0)
MCHC: 33.7 g/dL (ref 30.0–36.0)
MCV: 100 fl (ref 78.0–100.0)
Monocytes Absolute: 0.4 K/uL (ref 0.1–1.0)
Monocytes Relative: 11.3 % (ref 3.0–12.0)
Neutro Abs: 2 K/uL (ref 1.4–7.7)
Neutrophils Relative %: 50.6 % (ref 43.0–77.0)
Platelets: 192 K/uL (ref 150.0–400.0)
RBC: 4.01 Mil/uL — ABNORMAL LOW (ref 4.22–5.81)
RDW: 14.4 % (ref 11.5–15.5)
WBC: 3.9 K/uL — ABNORMAL LOW (ref 4.0–10.5)

## 2024-03-15 LAB — COMPREHENSIVE METABOLIC PANEL WITH GFR
ALT: 34 U/L (ref 0–53)
AST: 43 U/L — ABNORMAL HIGH (ref 0–37)
Albumin: 4.8 g/dL (ref 3.5–5.2)
Alkaline Phosphatase: 38 U/L — ABNORMAL LOW (ref 39–117)
BUN: 19 mg/dL (ref 6–23)
CO2: 29 meq/L (ref 19–32)
Calcium: 9.6 mg/dL (ref 8.4–10.5)
Chloride: 101 meq/L (ref 96–112)
Creatinine, Ser: 1.16 mg/dL (ref 0.40–1.50)
GFR: 68.47 mL/min (ref >60.00)
Glucose, Bld: 108 mg/dL — ABNORMAL HIGH (ref 70–99)
Potassium: 4.5 meq/L (ref 3.5–5.1)
Sodium: 139 meq/L (ref 135–145)
Total Bilirubin: 0.5 mg/dL (ref 0.2–1.2)
Total Protein: 6.9 g/dL (ref 6.0–8.3)

## 2024-03-15 LAB — LIPID PANEL W/REFLEX DIRECT LDL
Cholesterol: 229 mg/dL — ABNORMAL HIGH (ref <200)
HDL: 76 mg/dL (ref >=40)
LDL Cholesterol (Calc): 131 mg/dL — ABNORMAL HIGH
Non-HDL Cholesterol (Calc): 153 mg/dL — ABNORMAL HIGH (ref <130)
Total CHOL/HDL Ratio: 3 (calc) (ref <5.0)
Triglycerides: 116 mg/dL (ref <150)

## 2024-03-15 LAB — PSA: PSA: 0.4 ng/mL (ref 0.10–4.00)

## 2024-03-15 LAB — TSH: TSH: 2.07 u[IU]/mL (ref 0.35–5.50)

## 2024-03-18 ENCOUNTER — Other Ambulatory Visit: Payer: Self-pay | Admitting: Internal Medicine

## 2024-03-18 ENCOUNTER — Ambulatory Visit: Payer: Self-pay | Admitting: Internal Medicine

## 2024-03-18 DIAGNOSIS — E785 Hyperlipidemia, unspecified: Secondary | ICD-10-CM

## 2024-03-18 NOTE — Assessment & Plan Note (Signed)
 LDL is at goal on current statin therapy . CAC score is zero by 2024 testing. Continue current dose   Lab Results  Component Value Date   CHOL 229 (H) 03/15/2024   HDL 76 03/15/2024   LDLCALC 131 (H) 03/15/2024   TRIG 116 03/15/2024   CHOLHDL 3.0 03/15/2024

## 2024-03-25 ENCOUNTER — Other Ambulatory Visit: Payer: Self-pay

## 2024-03-25 MED ORDER — PITAVASTATIN CALCIUM 2 MG PO TABS
2.0000 mg | ORAL_TABLET | Freq: Every day | ORAL | 3 refills | Status: AC
  Filled 2024-03-25: qty 90, 90d supply, fill #0

## 2024-03-25 MED ORDER — MELOXICAM 15 MG PO TABS
15.0000 mg | ORAL_TABLET | Freq: Every day | ORAL | 3 refills | Status: AC
  Filled 2024-03-25: qty 90, 90d supply, fill #0

## 2024-03-31 ENCOUNTER — Encounter: Payer: Self-pay | Admitting: Dermatology

## 2024-03-31 ENCOUNTER — Ambulatory Visit: Admitting: Dermatology

## 2024-03-31 DIAGNOSIS — D229 Melanocytic nevi, unspecified: Secondary | ICD-10-CM

## 2024-03-31 DIAGNOSIS — L578 Other skin changes due to chronic exposure to nonionizing radiation: Secondary | ICD-10-CM | POA: Diagnosis not present

## 2024-03-31 DIAGNOSIS — D1801 Hemangioma of skin and subcutaneous tissue: Secondary | ICD-10-CM | POA: Diagnosis not present

## 2024-03-31 DIAGNOSIS — L570 Actinic keratosis: Secondary | ICD-10-CM

## 2024-03-31 DIAGNOSIS — Z1283 Encounter for screening for malignant neoplasm of skin: Secondary | ICD-10-CM | POA: Diagnosis not present

## 2024-03-31 DIAGNOSIS — Z85828 Personal history of other malignant neoplasm of skin: Secondary | ICD-10-CM

## 2024-03-31 DIAGNOSIS — Z86018 Personal history of other benign neoplasm: Secondary | ICD-10-CM | POA: Diagnosis not present

## 2024-03-31 DIAGNOSIS — L820 Inflamed seborrheic keratosis: Secondary | ICD-10-CM | POA: Diagnosis not present

## 2024-03-31 DIAGNOSIS — L738 Other specified follicular disorders: Secondary | ICD-10-CM

## 2024-03-31 DIAGNOSIS — W908XXA Exposure to other nonionizing radiation, initial encounter: Secondary | ICD-10-CM | POA: Diagnosis not present

## 2024-03-31 DIAGNOSIS — L814 Other melanin hyperpigmentation: Secondary | ICD-10-CM | POA: Diagnosis not present

## 2024-03-31 NOTE — Progress Notes (Signed)
 Follow-Up Visit   Subjective  Jose Boyer is a 58 y.o. male who presents for the following: Skin Cancer Screening and Upper Body Skin Exam hx of BCC, Dysplastic Nevus, AKs  The patient presents for Upper Body Skin Exam (UBSE) for skin cancer screening and mole check. The patient has spots, moles and lesions to be evaluated, some may be new or changing and the patient may have concern these could be cancer.    The following portions of the chart were reviewed this encounter and updated as appropriate: medications, allergies, medical history  Review of Systems:  No other skin or systemic complaints except as noted in HPI or Assessment and Plan.  Objective  Well appearing patient in no apparent distress; mood and affect are within normal limits.  All skin waist up examined. Relevant physical exam findings are noted in the Assessment and Plan.  Scalp x 4 (4) Pink scaly macules L temple x 1, forehead x 1, R arm x 2 (4) Stuck on waxy paps with erythema  Assessment & Plan   AK (ACTINIC KERATOSIS) (4) Scalp x 4 (4) Actinic keratoses are precancerous spots that appear secondary to cumulative UV radiation exposure/sun exposure over time. They are chronic with expected duration over 1 year. A portion of actinic keratoses will progress to squamous cell carcinoma of the skin. It is not possible to reliably predict which spots will progress to skin cancer and so treatment is recommended to prevent development of skin cancer.  Recommend daily broad spectrum sunscreen SPF 30+ to sun-exposed areas, reapply every 2 hours as needed.  Recommend staying in the shade or wearing long sleeves, sun glasses (UVA+UVB protection) and wide brim hats (4-inch brim around the entire circumference of the hat). Call for new or changing lesions. Destruction of lesion - Scalp x 4 (4) Complexity: simple   Destruction method: cryotherapy   Informed consent: discussed and consent obtained   Timeout:  patient  name, date of birth, surgical site, and procedure verified Lesion destroyed using liquid nitrogen: Yes   Region frozen until ice ball extended beyond lesion: Yes   Outcome: patient tolerated procedure well with no complications   Post-procedure details: wound care instructions given    INFLAMED SEBORRHEIC KERATOSIS (4) L temple x 1, forehead x 1, R arm x 2 (4) Symptomatic, irritating, patient would like treated. Destruction of lesion - L temple x 1, forehead x 1, R arm x 2 (4) Complexity: simple   Destruction method: cryotherapy   Informed consent: discussed and consent obtained   Timeout:  patient name, date of birth, surgical site, and procedure verified Lesion destroyed using liquid nitrogen: Yes   Region frozen until ice ball extended beyond lesion: Yes   Outcome: patient tolerated procedure well with no complications   Post-procedure details: wound care instructions given    Skin cancer screening performed today.  Actinic Damage - Chronic condition, secondary to cumulative UV/sun exposure - diffuse scaly erythematous macules with underlying dyspigmentation - Recommend daily broad spectrum sunscreen SPF 30+ to sun-exposed areas, reapply every 2 hours as needed.  - Staying in the shade or wearing long sleeves, sun glasses (UVA+UVB protection) and wide brim hats (4-inch brim around the entire circumference of the hat) are also recommended for sun protection.  - Call for new or changing lesions.  Lentigines, Seborrheic Keratoses, Hemangiomas - Benign normal skin lesions - Benign-appearing - Call for any changes  Melanocytic Nevi - Tan-brown and/or pink-flesh-colored symmetric macules and papules - Benign appearing  on exam today - Observation - Call clinic for new or changing moles - Recommend daily use of broad spectrum spf 30+ sunscreen to sun-exposed areas.   HISTORY OF BASAL CELL CARCINOMA OF THE SKIN - No evidence of recurrence today - Recommend regular full body skin  exams - Recommend daily broad spectrum sunscreen SPF 30+ to sun-exposed areas, reapply every 2 hours as needed.  - Call if any new or changing lesions are noted between office visits  - Right sideburn sup preauricular   HISTORY OF DYSPLASTIC NEVUS No evidence of recurrence today Recommend regular full body skin exams Recommend daily broad spectrum sunscreen SPF 30+ to sun-exposed areas, reapply every 2 hours as needed.  Call if any new or changing lesions are noted between office visits  - L inferior buttock  inf to the post waistline   Sebaceous Hyperplasia scalp - Small yellow papules with a central dell - Benign-appearing - Observe. Call for changes.   Return in about 6 months (around 10/01/2024) for TBSE, Hx of BCC, Hx of Dysplastic nevi, Hx of AKs.  I, Grayce Saunas, RMA, am acting as scribe for Alm Rhyme, MD .   Documentation: I have reviewed the above documentation for accuracy and completeness, and I agree with the above.  Alm Rhyme, MD

## 2024-03-31 NOTE — Patient Instructions (Addendum)

## 2024-04-06 ENCOUNTER — Encounter: Payer: Self-pay | Admitting: Dermatology

## 2024-04-12 ENCOUNTER — Other Ambulatory Visit: Payer: Self-pay

## 2024-04-12 MED ORDER — METOPROLOL SUCCINATE ER 25 MG PO TB24
25.0000 mg | ORAL_TABLET | Freq: Every day | ORAL | 3 refills | Status: AC
  Filled 2024-04-12: qty 90, 90d supply, fill #0

## 2024-04-13 ENCOUNTER — Other Ambulatory Visit: Payer: Self-pay

## 2024-04-13 MED ORDER — OMEPRAZOLE 40 MG PO CPDR: 40.0000 mg | DELAYED_RELEASE_CAPSULE | Freq: Two times a day (BID) | ORAL | 3 refills | Status: AC

## 2024-06-11 ENCOUNTER — Other Ambulatory Visit: Payer: Self-pay

## 2024-06-11 ENCOUNTER — Other Ambulatory Visit: Payer: Self-pay | Admitting: Internal Medicine

## 2024-06-11 MED ORDER — ESCITALOPRAM OXALATE 10 MG PO TABS
10.0000 mg | ORAL_TABLET | Freq: Every day | ORAL | 3 refills | Status: AC
  Filled 2024-06-11: qty 90, 90d supply, fill #0

## 2024-06-16 ENCOUNTER — Other Ambulatory Visit: Payer: Self-pay

## 2024-06-16 MED ORDER — CIPROFLOXACIN HCL 500 MG PO TABS
500.0000 mg | ORAL_TABLET | Freq: Two times a day (BID) | ORAL | 0 refills | Status: AC
  Filled 2024-06-16: qty 40, 20d supply, fill #0

## 2024-06-16 MED ORDER — DIPHENOXYLATE-ATROPINE 2.5-0.025 MG PO TABS
1.0000 | ORAL_TABLET | Freq: Four times a day (QID) | ORAL | 0 refills | Status: AC | PRN
  Filled 2024-06-16: qty 90, 12d supply, fill #0

## 2024-06-17 ENCOUNTER — Other Ambulatory Visit: Payer: Self-pay

## 2024-06-17 MED ORDER — OMEPRAZOLE 40 MG PO CPDR
40.0000 mg | DELAYED_RELEASE_CAPSULE | Freq: Two times a day (BID) | ORAL | 3 refills | Status: AC
  Filled 2024-06-17: qty 180, 90d supply, fill #0

## 2024-06-17 MED ORDER — AMOXICILLIN-POT CLAVULANATE 875-125 MG PO TABS
1.0000 | ORAL_TABLET | Freq: Two times a day (BID) | ORAL | 0 refills | Status: DC
  Filled 2024-06-17: qty 28, 14d supply, fill #0

## 2024-06-17 MED ORDER — MELOXICAM 15 MG PO TABS
15.0000 mg | ORAL_TABLET | Freq: Every day | ORAL | 3 refills | Status: AC
  Filled 2024-06-17: qty 90, 90d supply, fill #0

## 2024-06-17 MED ORDER — METOPROLOL SUCCINATE ER 25 MG PO TB24
25.0000 mg | ORAL_TABLET | Freq: Every day | ORAL | 3 refills | Status: AC
  Filled 2024-06-17: qty 90, 90d supply, fill #0

## 2024-06-17 MED ORDER — METHYLPREDNISOLONE 4 MG PO TBPK
ORAL_TABLET | ORAL | 0 refills | Status: DC
  Filled 2024-06-17: qty 21, 6d supply, fill #0

## 2024-06-17 MED ORDER — PITAVASTATIN CALCIUM 2 MG PO TABS
2.0000 mg | ORAL_TABLET | Freq: Every day | ORAL | 3 refills | Status: AC
  Filled 2024-06-17: qty 90, 90d supply, fill #0

## 2024-06-21 ENCOUNTER — Other Ambulatory Visit: Payer: Self-pay | Admitting: Surgery

## 2024-06-21 DIAGNOSIS — I712 Thoracic aortic aneurysm, without rupture, unspecified: Secondary | ICD-10-CM

## 2024-07-09 ENCOUNTER — Other Ambulatory Visit: Payer: Self-pay

## 2024-07-09 MED ORDER — OMEPRAZOLE 40 MG PO CPDR
40.0000 mg | DELAYED_RELEASE_CAPSULE | Freq: Two times a day (BID) | ORAL | 3 refills | Status: AC
  Filled 2024-07-09: qty 180, 90d supply, fill #0

## 2024-07-09 MED ORDER — METOPROLOL SUCCINATE ER 25 MG PO TB24
25.0000 mg | ORAL_TABLET | Freq: Every day | ORAL | 3 refills | Status: AC
  Filled 2024-07-09: qty 90, 90d supply, fill #0

## 2024-07-12 ENCOUNTER — Ambulatory Visit (HOSPITAL_COMMUNITY)

## 2024-07-19 ENCOUNTER — Ambulatory Visit

## 2024-07-19 ENCOUNTER — Other Ambulatory Visit

## 2024-07-22 ENCOUNTER — Ambulatory Visit
Admission: RE | Admit: 2024-07-22 | Discharge: 2024-07-22 | Disposition: A | Discharge status: Home / Self Care | Source: Ambulatory Visit | Attending: Surgery | Admitting: Surgery

## 2024-07-22 DIAGNOSIS — I712 Thoracic aortic aneurysm, without rupture, unspecified: Secondary | ICD-10-CM

## 2024-07-22 MED ORDER — IOPAMIDOL (ISOVUE-370) INJECTION 76%
75.0000 mL | Freq: Once | INTRAVENOUS | Status: AC | PRN
  Administered 2024-07-22: 100 mL via INTRAVENOUS

## 2024-08-23 ENCOUNTER — Ambulatory Visit: Admitting: Dermatology

## 2024-08-25 ENCOUNTER — Ambulatory Visit: Admitting: Dermatology

## 2024-08-25 ENCOUNTER — Encounter: Payer: Self-pay | Admitting: Dermatology

## 2024-08-25 DIAGNOSIS — Z86018 Personal history of other benign neoplasm: Secondary | ICD-10-CM | POA: Diagnosis not present

## 2024-08-25 DIAGNOSIS — L578 Other skin changes due to chronic exposure to nonionizing radiation: Secondary | ICD-10-CM

## 2024-08-25 DIAGNOSIS — L820 Inflamed seborrheic keratosis: Secondary | ICD-10-CM

## 2024-08-25 DIAGNOSIS — L814 Other melanin hyperpigmentation: Secondary | ICD-10-CM | POA: Diagnosis not present

## 2024-08-25 DIAGNOSIS — D229 Melanocytic nevi, unspecified: Secondary | ICD-10-CM

## 2024-08-25 DIAGNOSIS — Z1283 Encounter for screening for malignant neoplasm of skin: Secondary | ICD-10-CM | POA: Diagnosis not present

## 2024-08-25 DIAGNOSIS — Z85828 Personal history of other malignant neoplasm of skin: Secondary | ICD-10-CM | POA: Diagnosis not present

## 2024-08-25 DIAGNOSIS — D1801 Hemangioma of skin and subcutaneous tissue: Secondary | ICD-10-CM | POA: Diagnosis not present

## 2024-08-25 DIAGNOSIS — W908XXA Exposure to other nonionizing radiation, initial encounter: Secondary | ICD-10-CM | POA: Diagnosis not present

## 2024-08-25 DIAGNOSIS — L570 Actinic keratosis: Secondary | ICD-10-CM

## 2024-08-25 DIAGNOSIS — L821 Other seborrheic keratosis: Secondary | ICD-10-CM

## 2024-08-25 NOTE — Patient Instructions (Signed)

## 2024-08-25 NOTE — Progress Notes (Unsigned)
 Follow-Up Visit   Subjective  Jose Boyer is a 58 y.o. male who presents for the following: Skin Cancer Screening and Full Body Skin Exam. Hx of AKs. Hx of BCC. Hx of DN. patient reports no areas of concern.  The patient presents for Total-Body Skin Exam (TBSE) for skin cancer screening and mole check. The patient has spots, moles and lesions to be evaluated, some may be new or changing and the patient may have concern these could be cancer.    The following portions of the chart were reviewed this encounter and updated as appropriate: medications, allergies, medical history  Review of Systems:  No other skin or systemic complaints except as noted in HPI or Assessment and Plan.  Objective  Well appearing patient in no apparent distress; mood and affect are within normal limits.  A full examination was performed including scalp, head, eyes, ears, nose, lips, neck, chest, axillae, abdomen, back, buttocks, bilateral upper extremities, bilateral lower extremities, hands, feet, fingers, toes, fingernails, and toenails. All findings within normal limits unless otherwise noted below.   Relevant physical exam findings are noted in the Assessment and Plan.  Scalp x4, L sideburn x1, L temple x1, trunk x20 (26) Erythematous keratotic or waxy stuck-on papule or plaque. Scalp x1 Erythematous thin papules/macules with gritty scale.   Assessment & Plan   SKIN CANCER SCREENING PERFORMED TODAY.  ACTINIC DAMAGE - Chronic condition, secondary to cumulative UV/sun exposure - diffuse scaly erythematous macules with underlying dyspigmentation - Recommend daily broad spectrum sunscreen SPF 30+ to sun-exposed areas, reapply every 2 hours as needed.  - Staying in the shade or wearing long sleeves, sun glasses (UVA+UVB protection) and wide brim hats (4-inch brim around the entire circumference of the hat) are also recommended for sun protection.  - Call for new or changing lesions.  LENTIGINES,  SEBORRHEIC KERATOSES, HEMANGIOMAS - Benign normal skin lesions - Benign-appearing - Call for any changes  MELANOCYTIC NEVI - Tan-brown and/or pink-flesh-colored symmetric macules and papules - Benign appearing on exam today - Observation - Call clinic for new or changing moles - Recommend daily use of broad spectrum spf 30+ sunscreen to sun-exposed areas.       INFLAMED SEBORRHEIC KERATOSIS (26) Scalp x4, L sideburn x1, L temple x1, trunk x20 (26) Symptomatic, irritating, patient would like treated. - Destruction of lesion - Scalp x4, L sideburn x1, L temple x1, trunk x20 (26) Complexity: simple   Destruction method: cryotherapy   Informed consent: discussed and consent obtained   Timeout:  patient name, date of birth, surgical site, and procedure verified Lesion destroyed using liquid nitrogen: Yes   Region frozen until ice ball extended beyond lesion: Yes   Outcome: patient tolerated procedure well with no complications   Post-procedure details: wound care instructions given   Additional details:  Prior to procedure, discussed risks of blister formation, small wound, skin dyspigmentation, or rare scar following cryotherapy. Recommend Vaseline ointment to treated areas while healing.   AK (ACTINIC KERATOSIS) Scalp x1 Actinic keratoses are precancerous spots that appear secondary to cumulative UV radiation exposure/sun exposure over time. They are chronic with expected duration over 1 year. A portion of actinic keratoses will progress to squamous cell carcinoma of the skin. It is not possible to reliably predict which spots will progress to skin cancer and so treatment is recommended to prevent development of skin cancer.  Recommend daily broad spectrum sunscreen SPF 30+ to sun-exposed areas, reapply every 2 hours as needed.  Recommend staying  in the shade or wearing long sleeves, sun glasses (UVA+UVB protection) and wide brim hats (4-inch brim around the entire circumference of the  hat). Call for new or changing lesions. - Destruction of lesion - Scalp x1 Complexity: simple   Destruction method: cryotherapy   Informed consent: discussed and consent obtained   Timeout:  patient name, date of birth, surgical site, and procedure verified Lesion destroyed using liquid nitrogen: Yes   Region frozen until ice ball extended beyond lesion: Yes   Outcome: patient tolerated procedure well with no complications   Post-procedure details: wound care instructions given   Additional details:  Prior to procedure, discussed risks of blister formation, small wound, skin dyspigmentation, or rare scar following cryotherapy. Recommend Vaseline ointment to treated areas while healing.   Return in about 6 months (around 02/23/2025) for AK Follow Up, ISK Follow Up.  I, Kate Fought, CMA, am acting as scribe for Alm Rhyme, MD.   Documentation: I have reviewed the above documentation for accuracy and completeness, and I agree with the above.  Alm Rhyme, MD

## 2024-08-26 ENCOUNTER — Ambulatory Visit: Payer: 59 | Admitting: Dermatology

## 2024-08-26 ENCOUNTER — Encounter: Payer: Self-pay | Admitting: Dermatology

## 2024-08-30 ENCOUNTER — Other Ambulatory Visit: Payer: Self-pay | Admitting: Internal Medicine

## 2024-08-31 ENCOUNTER — Other Ambulatory Visit: Payer: Self-pay

## 2024-08-31 MED ORDER — MELOXICAM 15 MG PO TABS
15.0000 mg | ORAL_TABLET | Freq: Every day | ORAL | 3 refills | Status: AC
  Filled 2024-08-31: qty 90, 90d supply, fill #0

## 2024-08-31 MED ORDER — PITAVASTATIN CALCIUM 2 MG PO TABS
2.0000 mg | ORAL_TABLET | Freq: Every day | ORAL | 3 refills | Status: AC
  Filled 2024-08-31: qty 90, 90d supply, fill #0

## 2024-08-31 MED ORDER — METOPROLOL SUCCINATE ER 25 MG PO TB24
25.0000 mg | ORAL_TABLET | Freq: Every day | ORAL | 3 refills | Status: AC
  Filled 2024-08-31: qty 90, 90d supply, fill #0
  Filled 2024-09-01: qty 120, 120d supply, fill #0

## 2024-08-31 MED ORDER — EDARBI 40 MG PO TABS
1.0000 | ORAL_TABLET | Freq: Every day | ORAL | 3 refills | Status: AC
  Filled 2024-08-31: qty 90, 90d supply, fill #0

## 2024-08-31 MED ORDER — OMEPRAZOLE 40 MG PO CPDR
40.0000 mg | DELAYED_RELEASE_CAPSULE | Freq: Two times a day (BID) | ORAL | 3 refills | Status: AC
  Filled 2024-08-31: qty 180, 90d supply, fill #0

## 2024-08-31 MED ORDER — AMOXICILLIN-POT CLAVULANATE 875-125 MG PO TABS
1.0000 | ORAL_TABLET | Freq: Two times a day (BID) | ORAL | 0 refills | Status: AC
  Filled 2024-08-31: qty 42, 21d supply, fill #0

## 2024-09-01 ENCOUNTER — Other Ambulatory Visit: Payer: Self-pay

## 2024-10-06 ENCOUNTER — Ambulatory Visit: Admitting: Dermatology

## 2024-10-08 ENCOUNTER — Other Ambulatory Visit: Payer: Self-pay

## 2024-10-08 MED ORDER — METOPROLOL SUCCINATE ER 25 MG PO TB24
25.0000 mg | ORAL_TABLET | Freq: Every day | ORAL | 3 refills | Status: AC
  Filled 2024-10-08: qty 90, 90d supply, fill #0

## 2025-02-23 ENCOUNTER — Ambulatory Visit: Admitting: Dermatology
